# Patient Record
Sex: Female | Born: 1950 | Race: White | Hispanic: No | Marital: Married | Smoking: Never smoker
Health system: Southern US, Community
[De-identification: ages and names within clinical notes are randomized; demographics above are authoritative.]

## PROBLEM LIST (undated history)

## (undated) DIAGNOSIS — G629 Polyneuropathy, unspecified: Secondary | ICD-10-CM

## (undated) DIAGNOSIS — E559 Vitamin D deficiency, unspecified: Secondary | ICD-10-CM

## (undated) DIAGNOSIS — L988 Other specified disorders of the skin and subcutaneous tissue: Secondary | ICD-10-CM

## (undated) DIAGNOSIS — Z5189 Encounter for other specified aftercare: Secondary | ICD-10-CM

## (undated) DIAGNOSIS — R9389 Abnormal findings on diagnostic imaging of other specified body structures: Secondary | ICD-10-CM

## (undated) DIAGNOSIS — L409 Psoriasis, unspecified: Secondary | ICD-10-CM

## (undated) DIAGNOSIS — IMO0002 Reserved for concepts with insufficient information to code with codable children: Secondary | ICD-10-CM

## (undated) DIAGNOSIS — I10 Essential (primary) hypertension: Secondary | ICD-10-CM

## (undated) DIAGNOSIS — E785 Hyperlipidemia, unspecified: Secondary | ICD-10-CM

## (undated) DIAGNOSIS — H269 Unspecified cataract: Secondary | ICD-10-CM

## (undated) DIAGNOSIS — K635 Polyp of colon: Secondary | ICD-10-CM

## (undated) DIAGNOSIS — D219 Benign neoplasm of connective and other soft tissue, unspecified: Secondary | ICD-10-CM

## (undated) DIAGNOSIS — K6289 Other specified diseases of anus and rectum: Secondary | ICD-10-CM

## (undated) DIAGNOSIS — E079 Disorder of thyroid, unspecified: Secondary | ICD-10-CM

## (undated) HISTORY — DX: Encounter for other specified aftercare: Z51.89

## (undated) HISTORY — PX: IUD REMOVAL: SHX5392

## (undated) HISTORY — DX: Disorder of thyroid, unspecified: E07.9

## (undated) HISTORY — DX: Polyneuropathy, unspecified: G62.9

## (undated) HISTORY — DX: Vitamin D deficiency, unspecified: E55.9

## (undated) HISTORY — DX: Other specified diseases of anus and rectum: K62.89

## (undated) HISTORY — PX: POLYPECTOMY: SHX149

## (undated) HISTORY — DX: Essential (primary) hypertension: I10

## (undated) HISTORY — PX: CATARACT EXTRACTION, BILATERAL: SHX1313

## (undated) HISTORY — DX: Psoriasis, unspecified: L40.9

## (undated) HISTORY — DX: Benign neoplasm of connective and other soft tissue, unspecified: D21.9

## (undated) HISTORY — DX: Abnormal findings on diagnostic imaging of other specified body structures: R93.89

## (undated) HISTORY — DX: Unspecified cataract: H26.9

## (undated) HISTORY — PX: CATARACT EXTRACTION: SUR2

## (undated) HISTORY — DX: Reserved for concepts with insufficient information to code with codable children: IMO0002

## (undated) HISTORY — DX: Hyperlipidemia, unspecified: E78.5

## (undated) HISTORY — PX: COLONOSCOPY: SHX174

## (undated) HISTORY — DX: Polyp of colon: K63.5

## (undated) HISTORY — DX: Other specified disorders of the skin and subcutaneous tissue: L98.8

---

## 1977-10-15 HISTORY — PX: CHOLECYSTECTOMY: SHX55

## 1985-11-15 DIAGNOSIS — R87619 Unspecified abnormal cytological findings in specimens from cervix uteri: Secondary | ICD-10-CM

## 1985-11-15 DIAGNOSIS — IMO0002 Reserved for concepts with insufficient information to code with codable children: Secondary | ICD-10-CM

## 1985-11-15 HISTORY — DX: Unspecified abnormal cytological findings in specimens from cervix uteri: R87.619

## 1985-11-15 HISTORY — DX: Reserved for concepts with insufficient information to code with codable children: IMO0002

## 1999-02-24 ENCOUNTER — Other Ambulatory Visit: Admission: RE | Admit: 1999-02-24 | Discharge: 1999-02-24 | Payer: Self-pay | Admitting: *Deleted

## 2000-05-20 ENCOUNTER — Other Ambulatory Visit: Admission: RE | Admit: 2000-05-20 | Discharge: 2000-05-20 | Payer: Self-pay | Admitting: *Deleted

## 2001-05-21 ENCOUNTER — Other Ambulatory Visit: Admission: RE | Admit: 2001-05-21 | Discharge: 2001-05-21 | Payer: Self-pay | Admitting: *Deleted

## 2002-05-28 ENCOUNTER — Other Ambulatory Visit: Admission: RE | Admit: 2002-05-28 | Discharge: 2002-05-28 | Payer: Self-pay | Admitting: *Deleted

## 2003-06-03 ENCOUNTER — Other Ambulatory Visit: Admission: RE | Admit: 2003-06-03 | Discharge: 2003-06-03 | Payer: Self-pay | Admitting: *Deleted

## 2003-06-16 HISTORY — PX: HYSTEROSCOPY: SHX211

## 2003-06-25 ENCOUNTER — Ambulatory Visit (HOSPITAL_BASED_OUTPATIENT_CLINIC_OR_DEPARTMENT_OTHER): Admission: RE | Admit: 2003-06-25 | Discharge: 2003-06-25 | Payer: Self-pay | Admitting: Obstetrics and Gynecology

## 2003-06-25 ENCOUNTER — Ambulatory Visit (HOSPITAL_COMMUNITY): Admission: RE | Admit: 2003-06-25 | Discharge: 2003-06-25 | Payer: Self-pay | Admitting: Obstetrics and Gynecology

## 2003-06-25 ENCOUNTER — Encounter (INDEPENDENT_AMBULATORY_CARE_PROVIDER_SITE_OTHER): Payer: Self-pay | Admitting: Specialist

## 2004-06-07 ENCOUNTER — Other Ambulatory Visit: Admission: RE | Admit: 2004-06-07 | Discharge: 2004-06-07 | Payer: Self-pay | Admitting: *Deleted

## 2005-06-08 ENCOUNTER — Other Ambulatory Visit: Admission: RE | Admit: 2005-06-08 | Discharge: 2005-06-08 | Payer: Self-pay | Admitting: Obstetrics & Gynecology

## 2006-06-20 ENCOUNTER — Other Ambulatory Visit: Admission: RE | Admit: 2006-06-20 | Discharge: 2006-06-20 | Payer: Self-pay | Admitting: Obstetrics & Gynecology

## 2007-07-03 ENCOUNTER — Other Ambulatory Visit: Admission: RE | Admit: 2007-07-03 | Discharge: 2007-07-03 | Payer: Self-pay | Admitting: Obstetrics & Gynecology

## 2007-09-01 ENCOUNTER — Encounter (INDEPENDENT_AMBULATORY_CARE_PROVIDER_SITE_OTHER): Payer: Self-pay | Admitting: *Deleted

## 2007-09-01 ENCOUNTER — Ambulatory Visit (HOSPITAL_COMMUNITY): Admission: RE | Admit: 2007-09-01 | Discharge: 2007-09-01 | Payer: Self-pay | Admitting: *Deleted

## 2010-05-30 NOTE — Op Note (Signed)
NAME:  Kimberly Peck, GUNIA NO.:  [id number]   MEDICAL RECORD NO.:  [id number]          PATIENT TYPE:  AMB   LOCATION:  ENDO                         FACILITY:  [facility]   PHYSICIAN:  Georgiana Spinner, M.D.    DATE OF BIRTH:  Apr 16, 1950   DATE OF PROCEDURE:  DATE OF DISCHARGE:                               OPERATIVE REPORT   PROCEDURE:  Colonoscopy.   INDICATIONS:  Colon cancer screening.   ANESTHESIA:  Fentanyl 100 mcg, Versed 8 mg.   DESCRIPTION OF PROCEDURE:  With the patient mildly sedated in the left  lateral decubitus position, the Pentax videoscopic colonoscope was  inserted in the rectum and passed under direct vision with pressure  applied and the patient rolled to her back and subsequently to her right  side, we were able to reach the cecum, identified by ileocecal valve and  base of cecum, both of which were photographed.  From this point, the  colonoscope was slowly withdrawn taking circumferential views of the  colonic mucosa, stopping only to photograph diverticulum seen in the  sigmoid colon.  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  A polyp at 20 cm  that was also removed using hot biopsy forceps technique, setting of  20/150 blended current until we reached the rectum which appeared normal  on direct and showed small internal hemorrhoids on retroflexed view.  The endoscope was straightened and withdrawn.  The patient's vital signs  and pulse oximeter remained stable.  The patient tolerated the procedure  well without apparent complications.   FINDINGS:  Small internal hemorrhoids, polyp at 20 cm removed,  occasional diverticulum of the sigmoid colon, AVM of transverse colon,  otherwise an unremarkable exam.   PLAN:  Await biopsy report.  The patient will call me for results and  follow-up with me as needed as an outpatient.           ______________________________  Georgiana Spinner, M.D.     GMO/MEDQ  D:  09/01/2007  T:  09/01/2007  Job:  [id number]

## 2010-06-02 NOTE — Op Note (Signed)
NAME:  Kimberly Peck, Kimberly Peck                    ACCOUNT NO.:  [id number]   MEDICAL RECORD NO.:  [id number]                   PATIENT TYPE:  AMB   LOCATION:  [facility]                                 FACILITY:  [facility]   PHYSICIAN:  Laqueta Linden, M.D.                 DATE OF BIRTH:  10-15-1950   DATE OF PROCEDURE:  06/25/2003  DATE OF DISCHARGE:                                 OPERATIVE REPORT   PREOPERATIVE DIAGNOSES:  Imbedded intrauterine device, postmenopausal  bleeding.   POSTOPERATIVE DIAGNOSES:  Imbedded intrauterine device, postmenopausal  bleeding, uterine synechia.   PROCEDURE:  Operative hysteroscopic removal of IUD with diagnostic  hysteroscopy, curettage.   SURGEON:  Laqueta Linden, M.D.   ANESTHESIA:  General LMA.   ESTIMATED BLOOD LOSS:  Less than 50 mL.   SORBITOL NET INTAKE:  Less than 100 mL.   SPECIMENS:  IUD (ParaGard) discarded, endometrial curettings sent to  pathology.   COMPLICATIONS:  None.   INDICATIONS FOR PROCEDURE:  Kimberly Peck is a 60 year old menopausal  female who had a ParaGard IUD placed in October of 1998.  She had her last  menstrual period in October of 2002 and therefore no longer needs the IUD in  place.  Several attempts were made at office removal with traction on the  string but it appeared that the IUD was imbedded. Most recently she had an  episode of postmenopausal bleeding in December of 2005 like a regular  period.  It was felt that this should be evaluated as well and was very  likely related to the imbedded IUD. For this reason, the patient is to  undergo outpatient hysteroscopic evaluation with removal of the IUD,  evaluation of the uterine cavity and resection or curettage as indicated.  She has seen the informed consent film, voiced her understanding and  acceptance of all risks, benefits, alternatives, complications and  limitations of the procedure and agrees to proceed. She received Toradol 30  mg IV on call to the OR  and an additional 30 mg IM in the operating room  after anesthesia.   DESCRIPTION OF PROCEDURE:  The patient was taken to the operating room and  after proper identification and consents were ascertained, she was placed on  the operating table in supine position. General LMA anesthesia was  introduced and she was then placed in the Arena stirrups. The perineum and  vagina were prepped and draped in routine sterile fashion, the bladder was  entered with a red rubber catheter. The uterus was noted to be anterior  mobile with a large fundal fibroid noted. A speculum was placed in the  vagina, the cervix grasped with a single tooth tenaculum. The cervix was  distorted from prior cold knife conization. The internal os was gently  dilated to a #33 Pratt dilator and this was notable for a larger than  average amount of bleeding that appeared to aminate from inside the  uterine  cavity.  The hysteroscope was inserted under direct vision with the video.  The uterine cavity was quite distorted and appeared to have synechiae.  One  arm of the IUD was visualized protruding through one of the synechiae and  was grasped and the entire scope with IUD were then removed in one movement.  This was a ParaGard IUD and was discarded.  The scope was reinserted and  additional visualization of the uterine cavity revealed that there was a  normal appearing patch of fundus on the right aspect of the uterus that had  no lesion and looked atrophic. The remainder of the cavity towards the left  was distorted with synechiae and very scarred. There was no active bleeding  in the endometrial cavity.  The scope was then withdrawn and sharp curettage  was performed with virtually no tissue obtained, this was sent for  pathology.  All instruments were then removed, the tenaculum site was  hemostatic. There was no active bleeding from the cervix.  The patient was  stable and extubated on transfer to the recovery room. She  will be observed  and discharged per anesthesia protocol. She was given routine written and  verbal discharge instructions and is to followup in the office in one week.  She is to call sooner for excessive pain, fever, bleeding or other concerns  but knows to expect some moderate bleeding with possible clots over the next  few days. She is to take Advil or Aleve as needed for cramping and continue  all routine medications.                                               Laqueta Linden, M.D.    LKS/MEDQ  D:  06/25/2003  T:  06/25/2003  Job:  [id number]

## 2010-09-21 ENCOUNTER — Other Ambulatory Visit: Payer: Self-pay | Admitting: Obstetrics & Gynecology

## 2010-09-21 DIAGNOSIS — K604 Rectal fistula: Secondary | ICD-10-CM

## 2010-09-25 ENCOUNTER — Ambulatory Visit
Admission: RE | Admit: 2010-09-25 | Discharge: 2010-09-25 | Disposition: A | Discharge status: Home / Self Care | Payer: BC Managed Care – PPO | Source: Ambulatory Visit | Attending: Obstetrics & Gynecology | Admitting: Obstetrics & Gynecology

## 2010-09-25 DIAGNOSIS — K604 Rectal fistula: Secondary | ICD-10-CM

## 2010-09-25 MED ORDER — IOHEXOL 300 MG/ML  SOLN
100.0000 mL | Freq: Once | INTRAMUSCULAR | Status: AC | PRN
  Administered 2010-09-25: 100 mL via INTRAVENOUS

## 2010-10-13 ENCOUNTER — Encounter (INDEPENDENT_AMBULATORY_CARE_PROVIDER_SITE_OTHER): Payer: Self-pay | Admitting: General Surgery

## 2010-10-13 ENCOUNTER — Ambulatory Visit (INDEPENDENT_AMBULATORY_CARE_PROVIDER_SITE_OTHER): Payer: BC Managed Care – PPO | Admitting: General Surgery

## 2010-10-13 VITALS — BP 134/86 | HR 76 | Temp 97.9°F | Resp 16 | Ht 63.0 in | Wt 194.6 lb

## 2010-10-13 DIAGNOSIS — K603 Anal fistula: Secondary | ICD-10-CM | POA: Insufficient documentation

## 2010-10-13 NOTE — Progress Notes (Signed)
Chief Complaint  Patient presents with  . Other    new pt- eval of perirectal absc vs. fistula... pt not in pain just having drainging    HPI Kimberly Peck is a 60 y.o. female.   HPI 60 year old Caucasian female referred by Dr. Leda Quail for evaluation of rectal pain. The patient states that she's had several months of discomfort in her rectal area. The discomfort occurs at intermittent times. She describes an area that will get hard and swollen and become painful. It drained several times a week. It is mainly a yellowish liquid. She states that she started noticing problem several months ago when she started having an alteration in her bowel movements. Previously she did have one to 2 bowel movements a day. Now she reports on average 4 bowel movements a day. Some days however she will have 6-8 bowel movements a day. The initial bowel movements each day her solid but become progressively more liquid throughout the day. She denies any fecal urinary incontinence. She denies any personal or family history of inflammatory bowel disease. She denies any prior perirectal abscess or trauma. She denies any pain with defecation. She denies any melena, hematochezia, or weight loss. She denies any abdominal cramping or bloating. She does not sit on the bathroom toilet for prolonged periods of time. She states that she had a colonoscopy in 2010 and this was normal Past Medical History  Diagnosis Date  . Hypertension   . Thyroid disease     hypothyroidism  . Rectal pain     Past Surgical History  Procedure Date  . Cholecystectomy 1979  . Cataract extraction 2008    bilateral  . Intrauterine device insertion   . Iud removal     due to it embedding in the uterus    Family History  Problem Relation Age of Onset  . Cancer Mother     lung    Social History History  Substance Use Topics  . Smoking status: Never Smoker   . Smokeless tobacco: Not on file  . Alcohol Use: 4.2 oz/week    7  Glasses of wine per week    Allergies  Allergen Reactions  . Sulfa Drugs Cross Reactors Itching    Current Outpatient Prescriptions  Medication Sig Dispense Refill  . levothyroxine (SYNTHROID) 137 MCG tablet Take 137 mcg by mouth daily.        . nebivolol (BYSTOLIC) 10 MG tablet Take 10 mg by mouth daily.          Review of Systems Review of Systems  Constitutional: Negative for fever, chills, activity change, appetite change and unexpected weight change.  HENT: Negative for neck pain and neck stiffness.   Eyes: Negative for photophobia and redness.  Respiratory: Negative for chest tightness, shortness of breath and wheezing.   Cardiovascular: Negative for chest pain and leg swelling.  Gastrointestinal: Positive for rectal pain. Negative for nausea, vomiting, abdominal pain, constipation, abdominal distention and anal bleeding.       See hpi   Genitourinary: Negative for dysuria, urgency, hematuria, difficulty urinating and pelvic pain.  Musculoskeletal: Negative for back pain and arthralgias.  Skin: Negative.   Neurological: Negative for dizziness, tremors and syncope.  Hematological: Negative.   Psychiatric/Behavioral: Negative.     Blood pressure 134/86, pulse 76, temperature 97.9 F (36.6 C), temperature source Temporal, resp. rate 16, height 5\' 3"  (1.6 m), weight 194 lb 9.6 oz (88.27 kg).  Physical Exam Physical Exam  Vitals reviewed. Constitutional: She is  oriented to person, place, and time. She appears well-developed and well-nourished.  HENT:  Head: Normocephalic and atraumatic.  Eyes: Conjunctivae are normal. No scleral icterus.  Neck: Normal range of motion. Neck supple. No JVD present. No tracheal deviation present.  Cardiovascular: Normal rate, regular rhythm and normal heart sounds.   Pulmonary/Chest: Effort normal and breath sounds normal. No respiratory distress. She has no wheezes.  Abdominal: Soft. Bowel sounds are normal. She exhibits no distension.  There is no tenderness. There is no rebound and no guarding.         overweight  Genitourinary: Rectal exam shows tenderness. Rectal exam shows no fissure and anal tone normal.          Anoscopy attempted but aborted due to pt discomfort  Musculoskeletal: Normal range of motion. She exhibits no edema.  Lymphadenopathy:    She has no cervical adenopathy.  Neurological: She is alert and oriented to person, place, and time. She exhibits normal muscle tone.  Skin: Skin is warm and dry.  Psychiatric: She has a normal mood and affect. Her behavior is normal. Judgment and thought content normal.    Data Reviewed Dr Garen Lah note CT ab/pelvis from 09/25/10 - only showed a anterior uterine mass 9.7x8.3cm likely fibroid  Assessment    Rectal pain likely due to fistula in ano Obesity Hypothyroidism HTN Uterine fibroid    Plan    On physical exam as well as her history, this is consistent with a fistula in ano. By report she had a normal colonoscopy in 2010. I doubt she has developed Crohn's or ulcerative colitis in the interim. We discussed several treatment options. She is given educational material about fistulas. We discussed obtaining an MRI of her pelvis versus proceeding to the operating room for an exam under anesthesia.  I recommended an exam under anesthesia so that we could better delineate the sinus. We discussed several treatment options during surgery including fistulotomy versus seton placement. I explained to her that it does appear to be superficial, low fistula I would perform a fistulotomy. If the tract was high and complex, I would place a seton. We discussed the risk and benefits of surgery including but not limited to bleeding, infection, urinary retention, need for additional procedures, abscess formation, injury to the sphincter, incontinence, blood clot formation, and general anesthesia risk. We talked about typical postoperative course.  In the interim I recommended  that the patient increase her fiber intake. We discussed foods that were high in fiber. We also discussed fiber supplements.  We will plan to do an exam under anesthesia with fistulotomy versus seton placement. We also talked about the remote possibility of not being able to identify the fistula tract.  Mary Sella. Andrey Campanile, MD       Atilano Ina 10/13/2010, 3:28 PM

## 2010-10-13 NOTE — Patient Instructions (Addendum)
No aspirin or blood thinning products 5 days prior to surgery. Use one fleets enema per rectum the morning of surgery at least 2 hours prior to leaving the house.    Anal Fistula An anal fistula is an abnormal tunnel that leads from the anal canal (which carries stool from the large intestine) to a hole in the skin near the anus (the opening through which stool passes out of your body).  CAUSES Food you eat goes from your stomach into your intestine. As the food is digested, waste material (stool) forms. Stool passes through your large intestine, through the rectum and anal canal, and out of your body through the anus.  The anus has a number of tiny glands (clusters of specialized cells) that make lubricating fluid. Sometimes these glands can become infected. This type of infection may lead to the development of a pocket of pus (abscess). An anal fistula often develops after an infection or abscess; It is nearly always caused by a past anorectal abscess. You are at a higher risk of developing an anal fistula if you have:  Had an anal abscess.  Chronic inflammatory bowel disease, such as Crohn's disease or ulcerative colitis.   Conditions in which there are inflamed outpouchings of the intestinal wall (diverticulitis).   Colon or rectal cancer.  Sexually transmitted diseases involving the rectum, such as gonorrhea or Chlamydia.   History of anal radiation treatments, injury or surgery.   HIV infection.   A problem that has required treatment with steroid medicines for more than a short time.   SYMPTOMS   Anal pain, particularly around the area of a past abscess.  Drainage of pus, blood, stool or mucus from an opening in the skin.   Swelling around the skin opening   Worn off skin around the opening.   A hot or red area near the anus.  Diarrhea.   Fever and chills.   Tiredness (fatigue).   DIAGNOSIS  In some cases, the opening of an anal fistula is easily seen during a  physical exam.   A probe or scope may be used to help locate the opening of the fistula. In some cases, dye can be injected into the fistula opening, and x-rays can be taken to find the exact location and path of the fistula.   A sample (biopsy) of the fistula tissue or anus may be taken to check for cancer.  TREATMENT  An anal fistula may need surgery to open it up and allow it to heal. This type of operation is called a fistulotomy.   A specialized kind of glue or plug to seal the fistula may be used.   An antibiotic may be prescribed to treat an existing infection.  HOME CARE INSTRUCTIONS  Take medications (such as antibiotics) as prescribed by your caregiver.   Only take over-the-counter or prescription medicine for pain, discomfort or fever as directed by your caregiver.   Follow your prescribed diet. You might need a higher fiber diet to help avoid constipation.   Drink lots of water.   Use a stool softener or laxative, if recommended.   A warm sitz bath several times a day may be soothing, as well as help with healing.   Follow excellent hygiene to keep the anal area as clean as possible. Consider using pre-moistened towelettes to keep the anal area clean after using the bathroom.  SEEK MEDICAL CARE IF YOU:  Have increased pain not controlled with medicines prescribed.   Notice new  swelling, redness or hotness in the anal area.   Develop any problems passing urine.   Develop fever (more than 100.5 F (38.1 C).  SEEK IMMEDIATE MEDICAL CARE IF YOU:  Have severe, intolerable pain.   Have severe problems passing urine or cannot pass any urine at all.   Develop an unexplained oral temperature above 102.0 F (38.9 C).   Notice new or worsening leakage of blood, pus, mucus or stool.  Document Released: 12/15/2007 Document Re-Released: 03/28/2009 [facility] Patient Information 2011 [place], [place].

## 2013-01-19 ENCOUNTER — Encounter: Payer: Self-pay | Admitting: Obstetrics & Gynecology

## 2013-01-22 ENCOUNTER — Encounter: Payer: Self-pay | Admitting: Obstetrics & Gynecology

## 2013-01-22 ENCOUNTER — Ambulatory Visit (INDEPENDENT_AMBULATORY_CARE_PROVIDER_SITE_OTHER): Payer: BC Managed Care – PPO | Admitting: Obstetrics & Gynecology

## 2013-01-22 VITALS — BP 120/82 | HR 64 | Resp 12 | Ht 64.0 in | Wt 198.2 lb

## 2013-01-22 DIAGNOSIS — Z01419 Encounter for gynecological examination (general) (routine) without abnormal findings: Secondary | ICD-10-CM

## 2013-01-22 MED ORDER — NYSTATIN 100000 UNIT/GM EX POWD: Freq: Three times a day (TID) | CUTANEOUS | Status: DC

## 2013-01-22 MED ORDER — ALUMINUM CHLORIDE 20 % EX SOLN: Freq: Every day | CUTANEOUS | Status: DC

## 2013-01-22 MED ORDER — NYSTATIN 100000 UNIT/GM EX CREA: 1.0000 "application " | TOPICAL_CREAM | Freq: Two times a day (BID) | CUTANEOUS | Status: DC

## 2013-01-22 NOTE — Progress Notes (Signed)
63 y.o. G0P0000 MarriedCaucasianF here for annual exam.  Husband has children in [place].  Pt has six great grand children.  Saw Dr. Minna Antis in August.     Patient's last menstrual period was 01/15/2002.          Sexually active: yes  The current method of family planning is post menopausal status.    Exercising: yes  cardio, strength, and core Smoker:  no  Health Maintenance: Pap:  10/15/11 WNL/negative HR HPV History of abnormal Pap:  yes MMG:  07/09/12 normal Colonoscopy:  2009 repeat in 10 years BMD:   6/13, -1.7/-1.7 TDaP:  6/07 Screening Labs: PCP, Hb today: PCP, Urine today: PCP   reports that she has never smoked. She has never used smokeless tobacco. She reports that she drinks about 1.8 ounces of alcohol per week. She reports that she does not use illicit drugs.  Past Medical History  Diagnosis Date  . Hypertension   . Thyroid disease     hypothyroidism  . Rectal pain   . Abnormal Pap smear     CIN II/CKC  . Fibroids   . Fistula     benign-rectal    Past Surgical History  Procedure Laterality Date  . Cholecystectomy  1979  . Cataract extraction  2008    bilateral  . Intrauterine device insertion    . Iud removal      due to it embedding in the uterus    Current Outpatient Prescriptions  Medication Sig Dispense Refill  . calcium citrate-vitamin D (CITRACAL+D) 315-200 MG-UNIT per tablet Take 1 tablet by mouth daily.      . Cholecalciferol (VITAMIN D PO) Take 1,000 Int'l Units by mouth daily.       Marland Kitchen levothyroxine (SYNTHROID, LEVOTHROID) 150 MCG tablet Take 150 mcg by mouth daily before breakfast.      . Multiple Vitamins-Minerals (MULTIVITAMIN PO) Take by mouth daily.      . nebivolol (BYSTOLIC) 10 MG tablet Take 10 mg by mouth daily.         No current facility-administered medications for this visit.    Family History  Problem Relation Age of Onset  . Cancer Mother     lung    ROS:  Pertinent items are noted in HPI.  Otherwise, a comprehensive ROS was  negative.  Exam:   BP 120/82  Pulse 64  Resp 12  Ht 5\' 4"  (1.626 m)  Wt 198 lb 3.2 oz (89.903 kg)  BMI 34.00 kg/m2  LMP 01/15/2002  Weight change: -3lb  Height: 5\' 4"  (162.6 cm)  Ht Readings from Last 3 Encounters:  01/22/13 5\' 4"  (1.626 m)  10/13/10 5\' 3"  (1.6 m)    General appearance: alert, cooperative and appears stated age Head: Normocephalic, without obvious abnormality, atraumatic Neck: no adenopathy, supple, symmetrical, trachea midline and thyroid normal to inspection and palpation Lungs: clear to auscultation bilaterally Breasts: normal appearance, no masses or tenderness Heart: regular rate and rhythm Abdomen: soft, non-tender; bowel sounds normal; no masses,  no organomegaly Extremities: extremities normal, atraumatic, no cyanosis or edema Skin: Skin color, texture, turgor normal. Erythema underskin fold just above pubic symphasis Lymph nodes: Cervical, supraclavicular, and axillary nodes normal. No abnormal inguinal nodes palpated Neurologic: Grossly normal   Pelvic: External genitalia:  no lesions              Urethra:  normal appearing urethra with no masses, tenderness or lesions  Bartholins and Skenes: normal                 Vagina: normal appearing vagina with normal color and discharge, no lesions              Cervix: no lesions              Pap taken: no Bimanual Exam:  Uterus:  normal size, contour, position, consistency, mobility, non-tender and enlarged, 10 weeks size              Adnexa: normal adnexa               Rectovaginal: Confirms               Anus:  normal sphincter tone, no lesions  A:  Well Woman with normal exam H/o perirectal fistula, stable H/O 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 Lichen planus Hypertension H/O CIN 2 with CKC 1987  P:   Mammogram yearly pap smear with HR HPV 9/13.  No Pap today. Labs with Dr. Minna Antis Pt will try drysol for underarm moisture/odor Nystatin cream and powder rx's given for  skin changes.  If doesn't fully resolve skin issue, pt will call for return OV and skin biopsy. return annually or prn  An After Visit Summary was printed and given to the patient.

## 2013-01-22 NOTE — Patient Instructions (Signed)

## 2013-06-02 IMAGING — CT CT ABD-PELV W/ CM
2 of 5 series · 17 of 46 positions shown, 19 images · IV contrast (READICAT/WATER & [ID] OMNI 300)
Comparison: None

CLINICAL DATA: Rectal pain.  Diarrhea.

CT ABDOMEN AND PELVIS WITH CONTRAST
TECHNIQUE: Multidetector CT imaging of the abdomen and pelvis was
performed following the standard protocol during bolus
administration of intravenous contrast.
Contrast: 100mL OMNIPAQUE IOHEXOL 300 MG/ML IV SOLN

[Series 3: abd/pelvis with · axial · 0.75mm/px · z∈[-373,-18]mm · 14 of 79 slices shown, 16 images]
[im 4/79  soft-tissue]
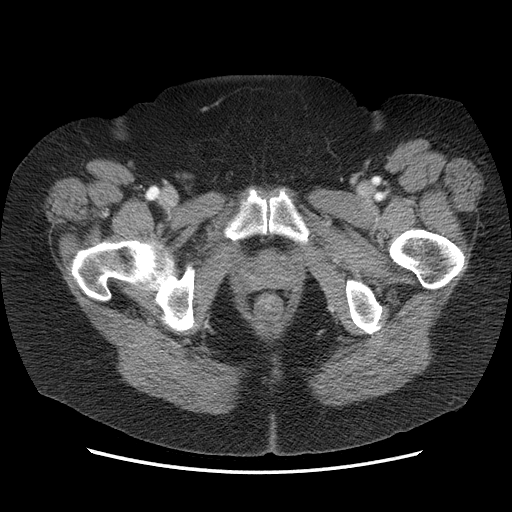
[im 4/79  bone]
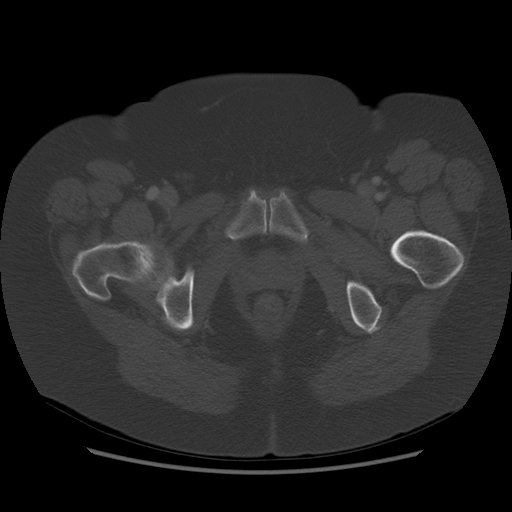
[im 12/79  soft-tissue]
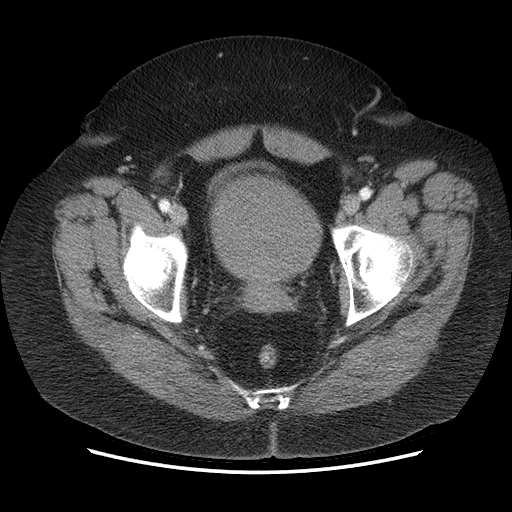
[im 16/79  soft-tissue]
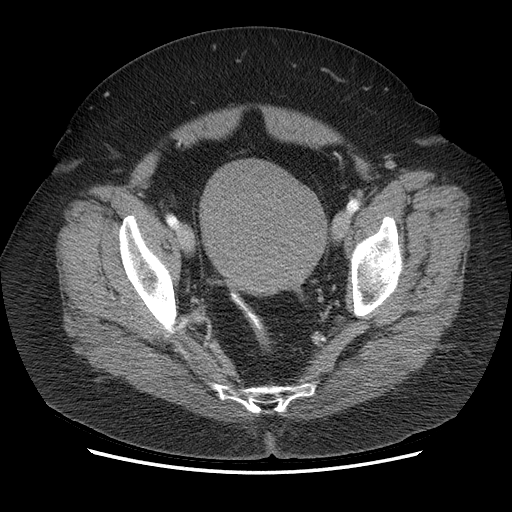
[im 20/79  soft-tissue]
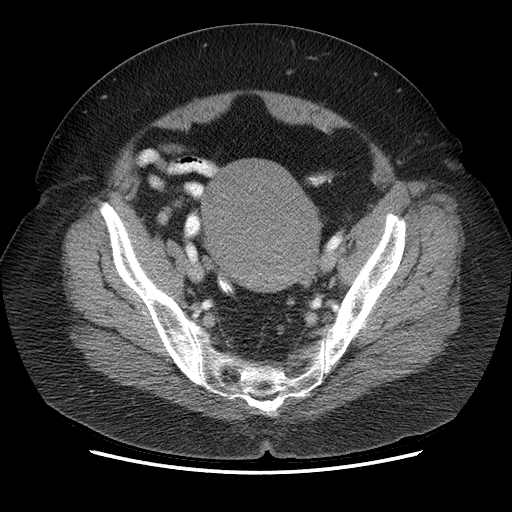
[im 28/79  soft-tissue]
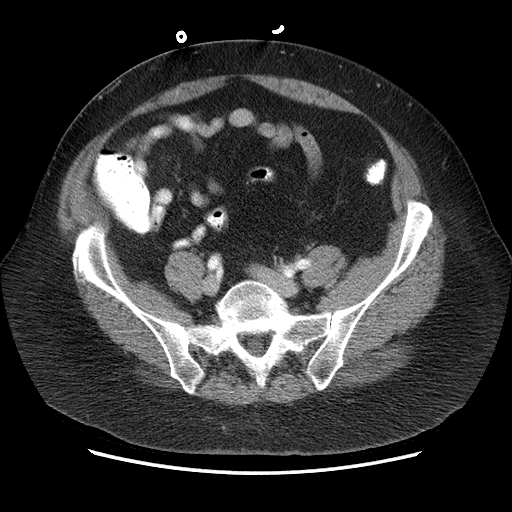
[im 32/79  soft-tissue]
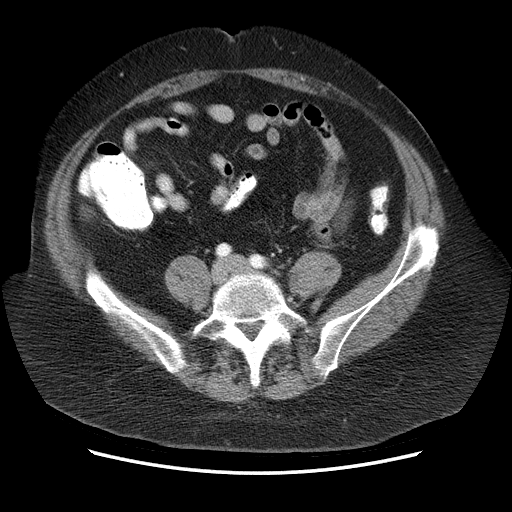
[im 36/79  soft-tissue]
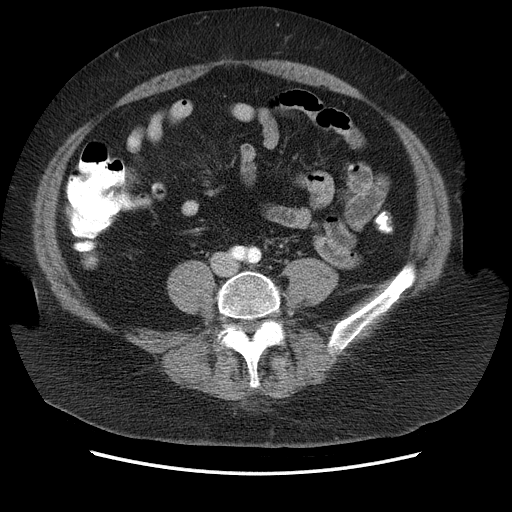
[im 43/79  soft-tissue]
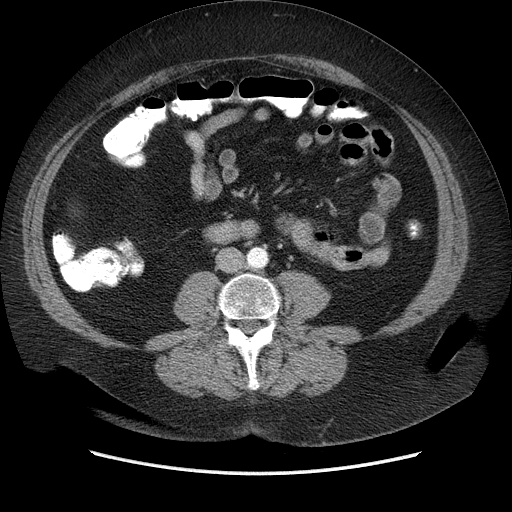
[im 47/79  soft-tissue]
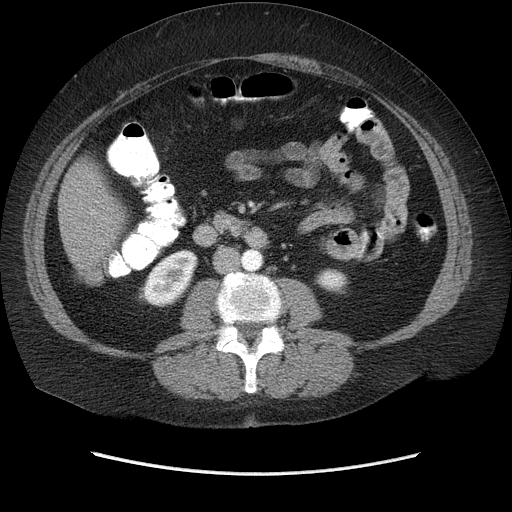
[im 47/79  bone]
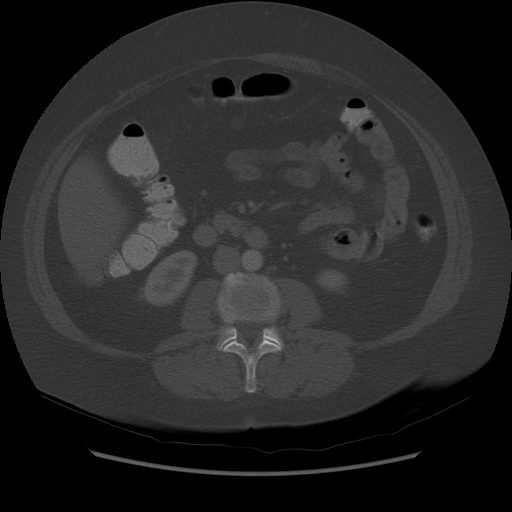
[im 51/79  soft-tissue]
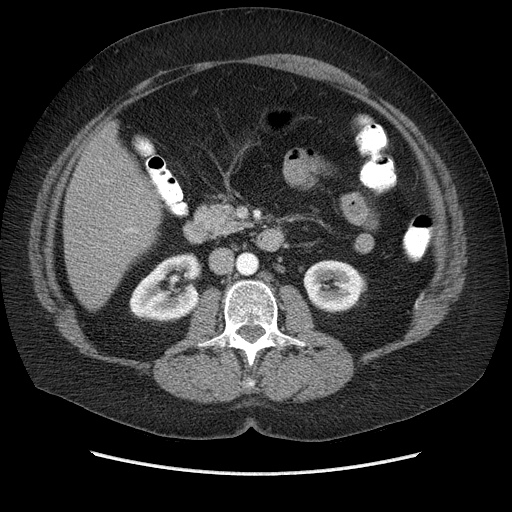
[im 59/79  soft-tissue]
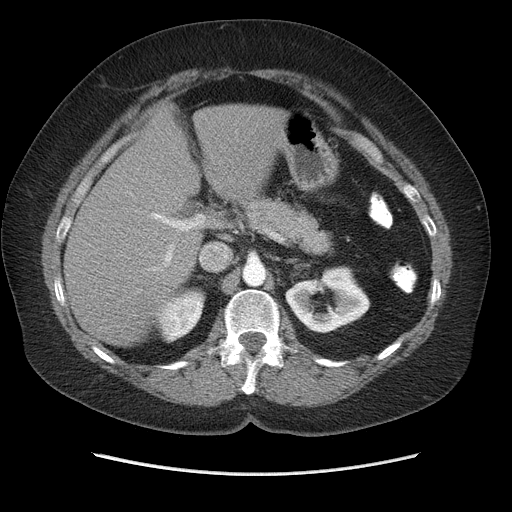
[im 63/79  soft-tissue]
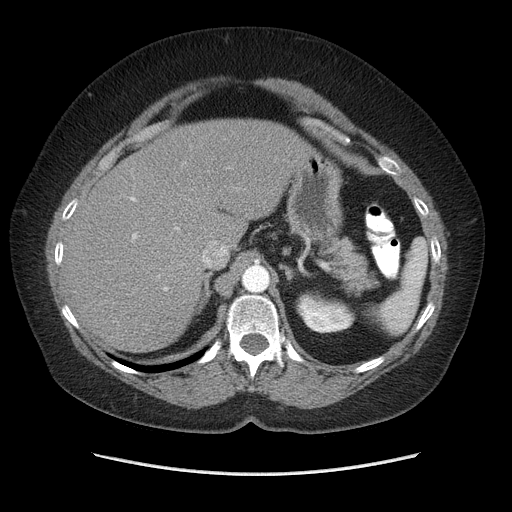
[im 67/79  soft-tissue]
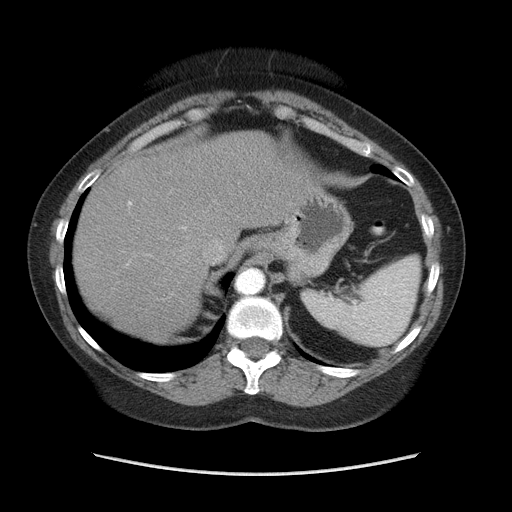
[im 75/79  soft-tissue]
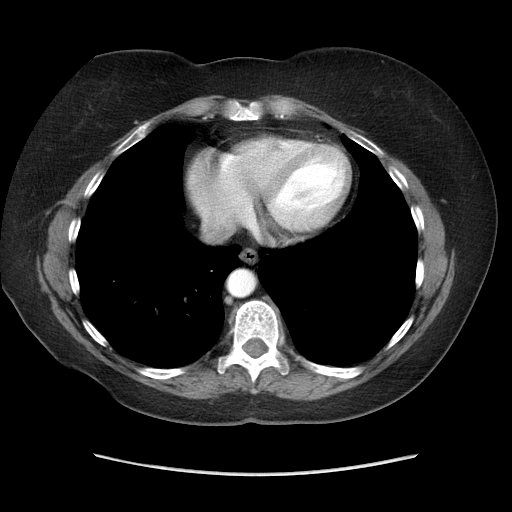

[Series 200: cor · coronal · 0.95mm/px · 3 of 145 slices shown]
[im 49/145  soft-tissue]
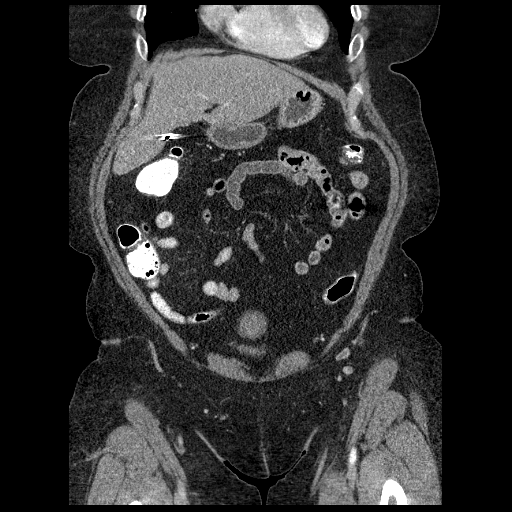
[im 65/145  soft-tissue]
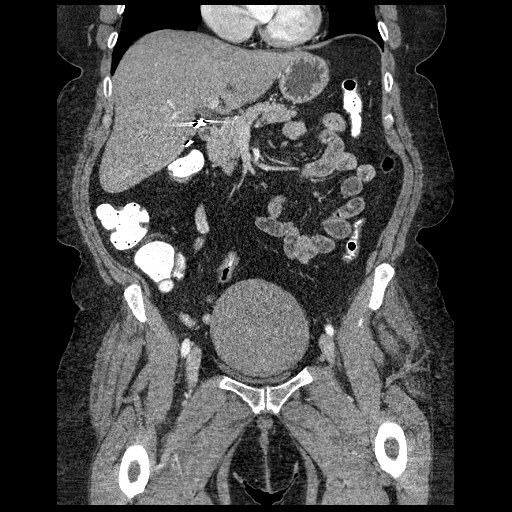
[im 81/145  soft-tissue]
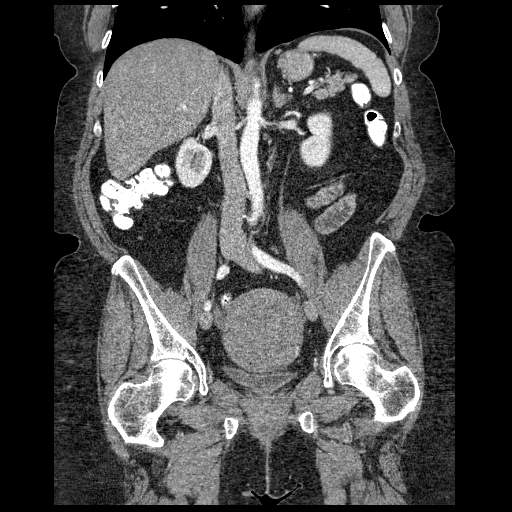

[17 of 46 positions shown; findings below may reference images not displayed]

FINDINGS: The lung bases are clear.

The liver is unremarkable.  No focal lesions or intrahepatic
biliary dilatation.  The gallbladder is surgically absent.  No
common bile duct dilatation.  The pancreas is normal.  The spleen
is normal in size.  No focal lesions.  The adrenal glands and
kidneys are unremarkable.

The stomach, duodenum, small bowel and colon demonstrate no
significant findings.  No inflammatory changes or mass.  No CT
findings to suggest a perirectal fistula.

The aorta is normal in caliber.  The major branch vessels are
normal.  No mesenteric or retroperitoneal masses or
lymphadenopathy.

There is a large anterior uterine mass.  This is likely a fibroid.
This measures 9.7 x 8.3 cm.  Both ovaries are normal. No pelvic
adenopathy or free pelvic fluid collections.  Small inguinal lymph
nodes are noted.

The bony structures are unremarkable.
IMPRESSION: 1.  9.7 x 8.3 cm anterior uterine mass.  This is most likely a
large fibroid. Pelvic MRI would be helpful for better
characterization.
2.  No CT findings for a perirectal abscess or fistula.

## 2014-01-27 ENCOUNTER — Telehealth: Payer: Self-pay | Admitting: Obstetrics & Gynecology

## 2014-01-27 NOTE — Telephone Encounter (Signed)
Pt left voicemail to cancel her aex with Dr Sabra Heck on 01/29/14. Called to reschedule. Left voicemail to call back.

## 2014-01-29 ENCOUNTER — Ambulatory Visit: Payer: BC Managed Care – PPO | Admitting: Obstetrics & Gynecology

## 2014-03-15 NOTE — Telephone Encounter (Signed)
Patient has AEX scheduled on 04/19/14 with DL//kn

## 2014-04-19 ENCOUNTER — Ambulatory Visit: Payer: Self-pay | Admitting: Certified Nurse Midwife

## 2014-04-19 ENCOUNTER — Telehealth: Payer: Self-pay | Admitting: Certified Nurse Midwife

## 2014-04-19 NOTE — Telephone Encounter (Signed)
canceled due to  [employer] medic[employer] billing to get authorization and call to reschedule

## 2014-04-26 NOTE — Telephone Encounter (Signed)
Patient is asking if authorization has been obtained from [employer] so that she can reschedule her aex appointment.

## 2014-04-26 NOTE — Telephone Encounter (Signed)
Called patient to advise that her preservice determination was forwarded to [facility] on 04/22/14. At this time we are waiting to hear back from them. I left her a voice mail message with instructions to call back if she has additional questions.

## 2014-04-27 NOTE — Telephone Encounter (Signed)
Pt called to reschedule. Pt scheduled with Dr Quincy Simmonds May 18. Authorization listed in appt info.

## 2014-04-27 NOTE — Telephone Encounter (Signed)
[employer] called with authorization # [id number], left message for patient to call and schedule aex.

## 2014-05-10 ENCOUNTER — Ambulatory Visit: Payer: Self-pay | Admitting: Certified Nurse Midwife

## 2014-06-02 ENCOUNTER — Encounter: Payer: Self-pay | Admitting: Obstetrics and Gynecology

## 2014-06-02 ENCOUNTER — Ambulatory Visit (INDEPENDENT_AMBULATORY_CARE_PROVIDER_SITE_OTHER): Payer: Medicare Other | Admitting: Obstetrics and Gynecology

## 2014-06-02 VITALS — BP 130/80 | HR 80 | Resp 16 | Ht 63.75 in | Wt 200.0 lb

## 2014-06-02 DIAGNOSIS — B372 Candidiasis of skin and nail: Secondary | ICD-10-CM | POA: Diagnosis not present

## 2014-06-02 DIAGNOSIS — Z01419 Encounter for gynecological examination (general) (routine) without abnormal findings: Secondary | ICD-10-CM

## 2014-06-02 DIAGNOSIS — M8580 Other specified disorders of bone density and structure, unspecified site: Secondary | ICD-10-CM

## 2014-06-02 MED ORDER — FLUCONAZOLE 150 MG PO TABS: 150.0000 mg | ORAL_TABLET | Freq: Once | ORAL | Status: DC

## 2014-06-02 NOTE — Patient Instructions (Signed)

## 2014-06-02 NOTE — Progress Notes (Signed)
Patient ID: Kimberly Peck, female   DOB: 12/18/1950, 64 y.o.   MRN: [id number] 63 y.o. [place] Married Caucasian female here for annual exam.    Having recurrent yeast infection on abdominal skin.  Has Nystatin cream and powder.   Frustrated with her weight.  Does not want to see nutritionist.   PCP: Tommy Medal, MD   Patient's last menstrual period was 01/15/2002.          Sexually active: Yes.    The current method of family planning is post menopausal status.    Exercising: YES  cardio, strength, core Smoker:  No  Health Maintenance: Pap:  10-15-11 wnl:neg HR HPV History of abnormal Pap:  no MMG:  07-13-13 fatty tissue/nl:[facility] Colonoscopy:  2009 normal with BMD:   06/2011   Result  -1.7/-1.7:[facility] TDaP:  06/2005 Screening Labs:  Hb today: PCP, Urine today: PCP   reports that she has never smoked. She has never used smokeless tobacco. She reports that she drinks about 1.8 - 2.4 oz of alcohol per week. She reports that she does not use illicit drugs.  Past Medical History  Diagnosis Date  . Hypertension   . Thyroid disease     hypothyroidism  . Rectal pain   . 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 Fibroids   . Fistula     benign-rectal    Past Surgical History  Procedure Laterality Date  . Cholecystectomy  10/79  . Cataract extraction Bilateral 3/08, 6/09  . Hysteroscopy  6/05    removal of imbedded IUD    Current Outpatient Prescriptions  Medication Sig Dispense Refill  . calcium citrate-vitamin D (CITRACAL+D) 315-200 MG-UNIT per tablet Take 1 tablet by mouth daily.    . Cholecalciferol (VITAMIN D PO) Take 1,000 Int'l Units by mouth daily.     Marland Kitchen levothyroxine (SYNTHROID, LEVOTHROID) 150 MCG tablet Take 150 mcg by mouth daily before breakfast.    . metoprolol tartrate (LOPRESSOR) 25 MG tablet Take 25 mg by mouth daily.    . Multiple Vitamins-Minerals (MULTIVITAMIN PO) Take by mouth daily.    Marland Kitchen aluminum chloride (DRYSOL) 20 % external solution Apply  topically at bedtime. (Patient not taking: Reported on 06/02/2014) 35 mL 5   No current facility-administered medications for this visit.    Family History  Problem Relation Age of Onset  . Cancer Mother     lung    ROS:  Pertinent items are noted in HPI.  Otherwise, a comprehensive ROS was negative.  Exam:   BP 130/80 mmHg  Pulse 80  Resp 16  Ht 5' 3.75" (1.619 m)  Wt 200 lb (90.719 kg)  BMI 34.61 kg/m2  LMP 01/15/2002    General appearance: alert, cooperative and appears stated age Head: Normocephalic, without obvious abnormality, atraumatic Neck: no adenopathy, supple, symmetrical, trachea midline and thyroid normal to inspection and palpation Lungs: clear to auscultation bilaterally Breasts: normal appearance, no masses or tenderness, Inspection negative, No nipple retraction or dimpling, No nipple discharge or bleeding Heart: regular rate and rhythm Abdomen: RUQ oblique incision, soft, non-tender; bowel sounds normal; no masses,  no organomegaly Extremities: extremities normal, atraumatic, no cyanosis or edema Skin: Skin color, texture, turgor normal.  Rash with skin cracking under pannus.  Lymph nodes: Cervical, supraclavicular, and axillary nodes normal. No abnormal inguinal nodes palpated Neurologic: Grossly normal  Pelvic: External genitalia:  no lesions              Urethra:  normal appearing  urethra with no masses, tenderness or lesions              Bartholins and Skenes: normal                 Vagina: normal appearing vagina with normal color and discharge, no lesions              Cervix: Unable to visualize due to limited ability to use stirrups due to leg cramps.              Pap taken: Yes.   Bimanual Exam:  Uterus:  normal size, contour, position, consistency, mobility, non-tender              Adnexa: normal adnexa and no mass, fullness, tenderness              Rectovaginal: Yes.  .  Confirms.              Anus:  normal sphincter tone, no lesions  Chaperone  was present for exam.  Assessment:   Well woman visit with normal exam. Osteopenia.  Candida infection of skin.  Limited pelvic exam due to body habitus.    Plan: Yearly mammogram recommended after age 36.  Recommended self breast exam.  Pap and HR HPV as above. Discussed Calcium, Vitamin D, regular exercise program including cardiovascular and weight bearing exercise. Labs performed.  No..   See orders. Refills given on medications.  Yes.  .  See orders.  See order for Diflucan.  Instructed in use.  Use Nystatin powder for maintenance and use cotton underwear! Offered pelvic ultrasound and this was declined, but patient will consider.  Bone density follow up with PCP.  Follow up annually and prn.      After visit summary provided.

## 2014-06-03 LAB — IPS PAP SMEAR ONLY

## 2015-02-28 DIAGNOSIS — E782 Mixed hyperlipidemia: Secondary | ICD-10-CM | POA: Diagnosis not present

## 2015-02-28 DIAGNOSIS — I10 Essential (primary) hypertension: Secondary | ICD-10-CM | POA: Diagnosis not present

## 2015-02-28 DIAGNOSIS — E039 Hypothyroidism, unspecified: Secondary | ICD-10-CM | POA: Diagnosis not present

## 2015-03-07 DIAGNOSIS — I10 Essential (primary) hypertension: Secondary | ICD-10-CM | POA: Diagnosis not present

## 2015-03-07 DIAGNOSIS — R21 Rash and other nonspecific skin eruption: Secondary | ICD-10-CM | POA: Diagnosis not present

## 2015-03-07 DIAGNOSIS — E782 Mixed hyperlipidemia: Secondary | ICD-10-CM | POA: Diagnosis not present

## 2015-03-07 DIAGNOSIS — E039 Hypothyroidism, unspecified: Secondary | ICD-10-CM | POA: Diagnosis not present

## 2015-06-03 ENCOUNTER — Ambulatory Visit (INDEPENDENT_AMBULATORY_CARE_PROVIDER_SITE_OTHER): Payer: Medicare Other | Admitting: Obstetrics and Gynecology

## 2015-06-03 ENCOUNTER — Encounter: Payer: Self-pay | Admitting: Obstetrics and Gynecology

## 2015-06-03 VITALS — BP 130/82 | HR 76 | Resp 16 | Ht 63.5 in | Wt 197.0 lb

## 2015-06-03 DIAGNOSIS — D259 Leiomyoma of uterus, unspecified: Secondary | ICD-10-CM

## 2015-06-03 DIAGNOSIS — R21 Rash and other nonspecific skin eruption: Secondary | ICD-10-CM

## 2015-06-03 DIAGNOSIS — Z01419 Encounter for gynecological examination (general) (routine) without abnormal findings: Secondary | ICD-10-CM

## 2015-06-03 DIAGNOSIS — Z124 Encounter for screening for malignant neoplasm of cervix: Secondary | ICD-10-CM

## 2015-06-03 MED ORDER — NYSTATIN 100000 UNIT/GM EX POWD: Freq: Three times a day (TID) | CUTANEOUS | Status: DC

## 2015-06-03 MED ORDER — FLUCONAZOLE 150 MG PO TABS: 150.0000 mg | ORAL_TABLET | Freq: Once | ORAL | Status: DC

## 2015-06-03 NOTE — Progress Notes (Signed)
Patient ID: [name], female   DOB: 08-04-1950, 65 y.o.   MRN: [id number] 65 y.o. [place] Married Caucasian female here for annual exam.    Has some yeast on the lower abdominal skin and maybe the back.   Had bone density in September 27, 2014 through PCP.  Minimal osteopenia was seen in the hips.  [employer].  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   PCP:   Merrilee Seashore, MD  Patient's last menstrual period was 01/15/2002.           Sexually active: Yes.  female  The current method of family planning is post menopausal status.    Exercising: Yes.    weights, strength, core and aerobics. Smoker:  no  Health Maintenance: Pap:  06-02-14 Neg(HR HPV neg 09/2011) History of abnormal Pap:  no MMG:  07-15-14 3D/Density A/Neg/BiRads1:[facility].   Colonoscopy:  2009 normal;next due 2019(unsure of GI name) BMD:   06/2011  Result:-1.7/-1.7:[facility].  2016 - mild osteopenia.  [employer]. TDaP:  PCP Gardasil:   N/A Hep C:  Declined.  Screening Labs:  Hb today: PCP, Urine today: PCP   reports that she has never smoked. She has never used smokeless tobacco. She reports that she drinks about 4.2 - 6.0 oz of alcohol per week. She reports that she does not use illicit drugs.  Past Medical History  Diagnosis Date  . Hypertension   . Thyroid disease     hypothyroidism  . Rectal pain   . 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 Fibroids   . Fistula     benign-rectal    Past Surgical History  Procedure Laterality Date  . Cholecystectomy  10/79  . Cataract extraction Bilateral 3/08, 6/09  . Hysteroscopy  6/05    removal of imbedded IUD    Current Outpatient Prescriptions  Medication Sig Dispense Refill  . Calcium Carb-Cholecalciferol (CALCIUM 600+D3) 600-200 MG-UNIT TABS Take 1 tablet by mouth daily.    . Cholecalciferol (VITAMIN D PO) Take 1,000 Int'l Units by mouth daily.     Marland Kitchen  levothyroxine (SYNTHROID, LEVOTHROID) 175 MCG tablet Take 1 tablet by mouth daily.  4  . metoprolol succinate (TOPROL-XL) 25 MG 24 hr tablet Take 1 tablet by mouth daily.  4  . Multiple Vitamins-Minerals (MULTIVITAMIN PO) Take by mouth daily.    Marland Kitchen triamcinolone cream (KENALOG) 0.1 % Apply 1 application topically as needed.  0   No current facility-administered medications for this visit.    Family History  Problem Relation Age of Onset  . Cancer Mother     lung    ROS:  Pertinent items are noted in HPI.  Otherwise, a comprehensive ROS was negative.  Exam:   BP 130/82 mmHg  Pulse 76  Resp 16  Ht 5' 3.5" (1.613 m)  Wt 197 lb (89.359 kg)  BMI 34.35 kg/m2  LMP 01/15/2002    General appearance: alert, cooperative and appears stated age Head: Normocephalic, without obvious abnormality, atraumatic Neck: no adenopathy, supple, symmetrical, trachea midline and thyroid normal to inspection and palpation Lungs: clear to auscultation bilaterally Breasts: normal appearance, no masses or tenderness, Inspection negative, No nipple retraction or dimpling, No nipple discharge or bleeding, No axillary or supraclavicular adenopathy Heart: regular rate and rhythm Abdomen: incisions:  Yes.     Right upper quadrant oblique , soft, non-tender; no masses, no organomegaly Extremities: extremities normal, atraumatic,  no cyanosis or edema Skin: Skin color, texture, turgor normal.  Excoriations of lower back in sacral area, over bilateral medial wrists, and lower abdomen suprapubically.  Lymph nodes: Cervical, supraclavicular, and axillary nodes normal. No abnormal inguinal nodes palpated Neurologic: Grossly normal  Pelvic: External genitalia:  Suprapubic erythema and excoriations under panus.               Urethra:  normal appearing urethra with no masses, tenderness or lesions              Bartholins and Skenes: normal                 Vagina: normal appearing vagina with normal color and discharge, no  lesions              Cervix:  difficult to see well.  Palpably normal.              Pap taken: No. Bimanual Exam:  Uterus:  midline pelvic mass? Nontender.              Adnexa: difficult to feel separately from mass.  BM exam limited by body habitus.               Rectal exam: Yes.  .  Confirms.              Anus:  normal sphincter tone, no lesions  Chaperone was present for exam.  Assessment:   Well woman visit. Hx of 10 cm uterine fibroid. Osteopenia.  Candida infection of skin.  Skin rash.  Chronic.  Plan: Yearly mammogram recommended after age 20.  Recommended self breast exam.  Pap and HR HPV as above. Recommendations for calcium, Vitamin D, regular exercise program including cardiovascular and weight bearing exercise. Labs performed.  No..   See orders. Prescription medication(s) given.   Yes. See orders.  Diflucan and Nystatin powder. Return for office pelvic ultrasound.  Referral to dermatology.  Follow up annually and prn.       After visit summary provided.

## 2015-06-03 NOTE — Patient Instructions (Signed)
Health Maintenance, Female Adopting a healthy lifestyle and getting preventive care can go a long way to promote health and wellness. Talk with your health care provider about what schedule of regular examinations is right for you. This is a good chance for you to check in with your provider about disease prevention and staying healthy. In between checkups, there are plenty of things you can do on your own. Experts have done a lot of research about which lifestyle changes and preventive measures are most likely to keep you healthy. Ask your health care provider for more information. WEIGHT AND DIET  Eat a healthy diet  Be sure to include plenty of vegetables, fruits, low-fat dairy products, and lean protein.  Do not eat a lot of foods high in solid fats, added sugars, or salt.  Get regular exercise. This is one of the most important things you can do for your health.  Most adults should exercise for at least 150 minutes each week. The exercise should increase your heart rate and make you sweat (moderate-intensity exercise).  Most adults should also do strengthening exercises at least twice a week. This is in addition to the moderate-intensity exercise.  Maintain a healthy weight  Body mass index (BMI) is a measurement that can be used to identify possible weight problems. It estimates body fat based on height and weight. Your health care provider can help determine your BMI and help you achieve or maintain a healthy weight.  For females 20 years of age and older:   A BMI below 18.5 is considered underweight.  A BMI of 18.5 to 24.9 is normal.  A BMI of 25 to 29.9 is considered overweight.  A BMI of 30 and above is considered obese.  Watch levels of cholesterol and blood lipids  You should start having your blood tested for lipids and cholesterol at 67 years of age, then have this test every 5 years.  You may need to have your cholesterol levels checked more often if:  Your lipid  or cholesterol levels are high.  You are older than 67 years of age.  You are at high risk for heart disease.  CANCER SCREENING   Lung Cancer  Lung cancer screening is recommended for adults 55-80 years old who are at high risk for lung cancer because of a history of smoking.  A yearly low-dose CT scan of the lungs is recommended for people who:  Currently smoke.  Have quit within the past 15 years.  Have at least a 30-pack-year history of smoking. A pack year is smoking an average of one pack of cigarettes a day for 1 year.  Yearly screening should continue until it has been 15 years since you quit.  Yearly screening should stop if you develop a health problem that would prevent you from having lung cancer treatment.  Breast Cancer  Practice breast self-awareness. This means understanding how your breasts normally appear and feel.  It also means doing regular breast self-exams. Let your health care provider know about any changes, no matter how small.  If you are in your 20s or 30s, you should have a clinical breast exam (CBE) by a health care provider every 1-3 years as part of a regular health exam.  If you are 40 or older, have a CBE every year. Also consider having a breast X-ray (mammogram) every year.  If you have a family history of breast cancer, talk to your health care provider about genetic screening.  If you   are at high risk for breast cancer, talk to your health care provider about having an MRI and a mammogram every year.  Breast cancer gene (BRCA) assessment is recommended for women who have family members with BRCA-related cancers. BRCA-related cancers include:  Breast.  Ovarian.  Tubal.  Peritoneal cancers.  Results of the assessment will determine the need for genetic counseling and BRCA1 and BRCA2 testing. Cervical Cancer Your health care provider may recommend that you be screened regularly for cancer of the pelvic organs (ovaries, uterus, and  vagina). This screening involves a pelvic examination, including checking for microscopic changes to the surface of your cervix (Pap test). You may be encouraged to have this screening done every 3 years, beginning at age 21.  For women ages 30-65, health care providers may recommend pelvic exams and Pap testing every 3 years, or they may recommend the Pap and pelvic exam, combined with testing for human papilloma virus (HPV), every 5 years. Some types of HPV increase your risk of cervical cancer. Testing for HPV may also be done on women of any age with unclear Pap test results.  Other health care providers may not recommend any screening for nonpregnant women who are considered low risk for pelvic cancer and who do not have symptoms. Ask your health care provider if a screening pelvic exam is right for you.  If you have had past treatment for cervical cancer or a condition that could lead to cancer, you need Pap tests and screening for cancer for at least 20 years after your treatment. If Pap tests have been discontinued, your risk factors (such as having a new sexual partner) need to be reassessed to determine if screening should resume. Some women have medical problems that increase the chance of getting cervical cancer. In these cases, your health care provider may recommend more frequent screening and Pap tests. Colorectal Cancer  This type of cancer can be detected and often prevented.  Routine colorectal cancer screening usually begins at 67 years of age and continues through 67 years of age.  Your health care provider may recommend screening at an earlier age if you have risk factors for colon cancer.  Your health care provider may also recommend using home test kits to check for hidden blood in the stool.  A small camera at the end of a tube can be used to examine your colon directly (sigmoidoscopy or colonoscopy). This is done to check for the earliest forms of colorectal  cancer.  Routine screening usually begins at age 50.  Direct examination of the colon should be repeated every 5-10 years through 67 years of age. However, you may need to be screened more often if early forms of precancerous polyps or small growths are found. Skin Cancer  Check your skin from head to toe regularly.  Tell your health care provider about any new moles or changes in moles, especially if there is a change in a mole's shape or color.  Also tell your health care provider if you have a mole that is larger than the size of a pencil eraser.  Always use sunscreen. Apply sunscreen liberally and repeatedly throughout the day.  Protect yourself by wearing long sleeves, pants, a wide-brimmed hat, and sunglasses whenever you are outside. HEART DISEASE, DIABETES, AND HIGH BLOOD PRESSURE   High blood pressure causes heart disease and increases the risk of stroke. High blood pressure is more likely to develop in:  People who have blood pressure in the high end   of the normal range (130-139/85-89 mm Hg).  People who are overweight or obese.  People who are African American.  If you are 38-23 years of age, have your blood pressure checked every 3-5 years. If you are 61 years of age or older, have your blood pressure checked every year. You should have your blood pressure measured twice--once when you are at a hospital or clinic, and once when you are not at a hospital or clinic. Record the average of the two measurements. To check your blood pressure when you are not at a hospital or clinic, you can use:  An automated blood pressure machine at a pharmacy.  A home blood pressure monitor.  If you are between 45 years and 39 years old, ask your health care provider if you should take aspirin to prevent strokes.  Have regular diabetes screenings. This involves taking a blood sample to check your fasting blood sugar level.  If you are at a normal weight and have a low risk for diabetes,  have this test once every three years after 68 years of age.  If you are overweight and have a high risk for diabetes, consider being tested at a younger age or more often. PREVENTING INFECTION  Hepatitis B  If you have a higher risk for hepatitis B, you should be screened for this virus. You are considered at high risk for hepatitis B if:  You were born in a country where hepatitis B is common. Ask your health care provider which countries are considered high risk.  Your parents were born in a high-risk country, and you have not been immunized against hepatitis B (hepatitis B vaccine).  You have HIV or AIDS.  You use needles to inject street drugs.  You live with someone who has hepatitis B.  You have had sex with someone who has hepatitis B.  You get hemodialysis treatment.  You take certain medicines for conditions, including cancer, organ transplantation, and autoimmune conditions. Hepatitis C  Blood testing is recommended for:  Everyone born from [birth year] through 1967.  Anyone with known risk factors for hepatitis C. Sexually transmitted infections (STIs)  You should be screened for sexually transmitted infections (STIs) including gonorrhea and chlamydia if:  You are sexually active and are younger than 67 years of age.  You are older than 67 years of age and your health care provider tells you that you are at risk for this type of infection.  Your sexual activity has changed since you were last screened and you are at an increased risk for chlamydia or gonorrhea. Ask your health care provider if you are at risk.  If you do not have HIV, but are at risk, it may be recommended that you take a prescription medicine daily to prevent HIV infection. This is called pre-exposure prophylaxis (PrEP). You are considered at risk if:  You are sexually active and do not regularly use condoms or know the HIV status of your partner(s).  You take drugs by injection.  You are sexually  active with a partner who has HIV. Talk with your health care provider about whether you are at high risk of being infected with HIV. If you choose to begin PrEP, you should first be tested for HIV. You should then be tested every 3 months for as long as you are taking PrEP.  PREGNANCY   If you are premenopausal and you may become pregnant, ask your health care provider about preconception counseling.  If you may  become pregnant, take 400 to 800 micrograms (mcg) of folic acid every day.  If you want to prevent pregnancy, talk to your health care provider about birth control (contraception). OSTEOPOROSIS AND MENOPAUSE   Osteoporosis is a disease in which the bones lose minerals and strength with aging. This can result in serious bone fractures. Your risk for osteoporosis can be identified using a bone density scan.  If you are 61 years of age or older, or if you are at risk for osteoporosis and fractures, ask your health care provider if you should be screened.  Ask your health care provider whether you should take a calcium or vitamin D supplement to lower your risk for osteoporosis.  Menopause may have certain physical symptoms and risks.  Hormone replacement therapy may reduce some of these symptoms and risks. Talk to your health care provider about whether hormone replacement therapy is right for you.  HOME CARE INSTRUCTIONS   Schedule regular health, dental, and eye exams.  Stay current with your immunizations.   Do not use any tobacco products including cigarettes, chewing tobacco, or electronic cigarettes.  If you are pregnant, do not drink alcohol.  If you are breastfeeding, limit how much and how often you drink alcohol.  Limit alcohol intake to no more than 1 drink per day for nonpregnant women. One drink equals 12 ounces of beer, 5 ounces of wine, or 1 ounces of hard liquor.  Do not use street drugs.  Do not share needles.  Ask your health care provider for help if  you need support or information about quitting drugs.  Tell your health care provider if you often feel depressed.  Tell your health care provider if you have ever been abused or do not feel safe at home.   This information is not intended to replace advice given to you by your health care provider. Make sure you discuss any questions you have with your health care provider.   Document Released: 07/17/2010 Document Revised: 01/22/2014 Document Reviewed: 12/03/2012 Elsevier Interactive Patient Education [employer].

## 2015-06-06 ENCOUNTER — Telehealth: Payer: Self-pay | Admitting: Obstetrics and Gynecology

## 2015-06-06 NOTE — Telephone Encounter (Signed)
Left detailed referral voicemail on both of patients contact numbers per most recent dpr.   [facility] [address] [place], [place] [address] [phone]  06/08/15 arrive at 840am Dr Marcine Matar

## 2015-06-07 NOTE — Telephone Encounter (Signed)
Patient is returning a call to [place]. She also is calling about scheduling an ultrasound appointment.

## 2015-06-08 DIAGNOSIS — L280 Lichen simplex chronicus: Secondary | ICD-10-CM | POA: Diagnosis not present

## 2015-06-08 DIAGNOSIS — L304 Erythema intertrigo: Secondary | ICD-10-CM | POA: Diagnosis not present

## 2015-06-16 ENCOUNTER — Encounter: Payer: Self-pay | Admitting: Obstetrics and Gynecology

## 2015-06-16 ENCOUNTER — Ambulatory Visit (INDEPENDENT_AMBULATORY_CARE_PROVIDER_SITE_OTHER): Payer: Medicare Other | Admitting: Obstetrics and Gynecology

## 2015-06-16 ENCOUNTER — Ambulatory Visit (INDEPENDENT_AMBULATORY_CARE_PROVIDER_SITE_OTHER): Payer: Medicare Other

## 2015-06-16 VITALS — Ht 63.5 in

## 2015-06-16 DIAGNOSIS — R938 Abnormal findings on diagnostic imaging of other specified body structures: Secondary | ICD-10-CM | POA: Diagnosis not present

## 2015-06-16 DIAGNOSIS — D252 Subserosal leiomyoma of uterus: Secondary | ICD-10-CM

## 2015-06-16 DIAGNOSIS — D259 Leiomyoma of uterus, unspecified: Secondary | ICD-10-CM | POA: Diagnosis not present

## 2015-06-16 DIAGNOSIS — R9389 Abnormal findings on diagnostic imaging of other specified body structures: Secondary | ICD-10-CM

## 2015-06-16 NOTE — Progress Notes (Signed)
GYNECOLOGY  VISIT   HPI: 65 y.o.   Married  Caucasian  female   G0P0000 with Patient's last menstrual period was 01/15/2002.   here for   Ultrasound.  Has hx of 10 cm uterine fibroid.  Uterus enlarged on routine pelvic exam.   Denies any postmenopausal bleeding.   Has poison ivy on her wrists. Saw dermatology.  Using steroid cream.   GYNECOLOGIC HISTORY: Patient's last menstrual period was 01/15/2002.  Pap: 06-02-14 Neg(HR HPV neg 09/2011) History of abnormal Pap: no MMG: 07-15-14 3D/Density A/Neg/BiRads1:[facility].         OB History    Gravida Para Term Preterm AB TAB SAB Ectopic Multiple Living   0 0 0 0 0 0 0 0 0 0          Patient Active Problem List   Diagnosis Date Noted  . 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 Hypertension   . Thyroid disease     hypothyroidism  . Rectal pain   . 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 Fibroids   . Fistula     benign-rectal    Past Surgical History  Procedure Laterality Date  . Cholecystectomy  10/79  . Cataract extraction Bilateral 3/08, 6/09  . Hysteroscopy  6/05    removal of imbedded IUD    Current Outpatient Prescriptions  Medication Sig Dispense Refill  . Calcium Carb-Cholecalciferol (CALCIUM 600+D3) 600-200 MG-UNIT TABS Take 1 tablet by mouth daily.    . Cholecalciferol (VITAMIN D PO) Take 1,000 Int'l Units by mouth daily.     . ciclopirox (LOPROX) 0.77 % cream Apply 1 application topically 2 (two) times daily.    . clobetasol ointment (TEMOVATE) AB-123456789 % Apply 1 application topically 2 (two) times daily.  0  . fluconazole (DIFLUCAN) 150 MG tablet Take 1 tablet (150 mg total) by mouth once. Take one tablet.  Repeat in 72 hours if symptoms are not completely resolved. 2 tablet 0  . levothyroxine (SYNTHROID, LEVOTHROID) 175 MCG tablet Take 1 tablet by mouth daily.  4  . metoprolol succinate (TOPROL-XL) 25 MG 24 hr tablet Take 1 tablet by mouth daily.  4  .  Multiple Vitamins-Minerals (MULTIVITAMIN PO) Take by mouth daily.    Marland Kitchen nystatin (MYCOSTATIN) powder Apply topically 3 (three) times daily. Apply to affected area for up to 7 days 15 g 2  . triamcinolone cream (KENALOG) 0.1 % Apply 1 application topically as needed.  0   No current facility-administered medications for this visit.     ALLERGIES: Hctz and Sulfa drugs cross reactors  Family History  Problem Relation Age of Onset  . Cancer Mother     lung    Social History   Social History  . Marital Status: Married    Spouse Name: N/A  . Number of Children: N/A  . Years of Education: N/A   Occupational History  . Not on file.   Social History Main Topics  . Smoking status: Never Smoker   . Smokeless tobacco: Never Used  . Alcohol Use: 4.2 - 6.0 oz/week    7-10 Glasses of wine per week     Comment: 0000000 alcoholic beverages/week(beer,wine or liquor)  . Drug Use: No  . Sexual Activity:    Partners: Male    Birth Control/ Protection: Post-menopausal   Other Topics Concern  . Not on file   Social History Narrative    ROS:  Pertinent items are  noted in HPI.  PHYSICAL EXAMINATION:    Ht 5' 3.5" (1.613 m)  LMP 01/15/2002     General appearance: alert, cooperative and appears stated age     Ultrasound images and report reviewed with the patient.  Uterus - 84 x 94 mm subserosal anterior fibroid. EMS - 12.23mm on transvaginal scan.   4.7 mm on transabdominal scan. Right ovary normal.  Left ovary not seen on transvaginal scan.  No free fluid.  ASSESSMENT  Stable uterine fibroid.  Thickened endometrium.  PLAN  Discussion of uterine fibroids and endometrial thickening.  Based on the discrepancy of the endometrial lining measurements and the possibility of endometrial pathology such as endometrial cancer, endometrial biopsy is recommended.  Patient will return for this appointment.  The procedure has been explained.    An After Visit Summary was printed and  given to the patient.  __15____ minutes face to face time of which over 50% was spent in counseling.

## 2015-06-16 NOTE — Patient Instructions (Signed)
Endometrial Biopsy Endometrial biopsy is a procedure in which a tissue sample is taken from inside the uterus. The tissue sample is then looked at under a microscope to see if the tissue is normal or abnormal. The endometrium is the lining of the uterus. This procedure helps determine where you are in your menstrual cycle and how hormone levels are affecting the lining of the uterus. This procedure may also be used to evaluate uterine bleeding or to diagnose endometrial cancer, tuberculosis, polyps, or inflammatory conditions.  LET YOUR HEALTH CARE PROVIDER KNOW ABOUT:  Any allergies you have.  All medicines you are taking, including vitamins, herbs, eye drops, creams, and over-the-counter medicines.  Previous problems you or members of your family have had with the use of anesthetics.  Any blood disorders you have.  Previous surgeries you have had.  Medical conditions you have.  Possibility of pregnancy. RISKS AND COMPLICATIONS Generally, this is a safe procedure. However, as with any procedure, complications can occur. Possible complications include:  Bleeding.  Pelvic infection.  Puncture of the uterine wall with the biopsy device (rare). BEFORE THE PROCEDURE   Keep a record of your menstrual cycles as directed by your health care provider. You may need to schedule your procedure for a specific time in your cycle.  You may want to bring a sanitary pad to wear home after the procedure.  Arrange for someone to drive you home after the procedure if you will be given a medicine to help you relax (sedative). PROCEDURE   You may be given a sedative to relax you.  You will lie on an exam table with your feet and legs supported as in a pelvic exam.  Your health care provider will insert an instrument (speculum) into your vagina to see your cervix.  Your cervix will be cleansed with an antiseptic solution. A medicine (local anesthetic) will be used to numb the cervix.  A forceps  instrument (tenaculum) will be used to hold your cervix steady for the biopsy.  A thin, rodlike instrument (uterine sound) will be inserted through your cervix to determine the length of your uterus and the location where the biopsy sample will be removed.  A thin, flexible tube (catheter) will be inserted through your cervix and into the uterus. The catheter is used to collect the biopsy sample from your endometrial tissue.  The catheter and speculum will then be removed, and the tissue sample will be sent to a lab for examination. AFTER THE PROCEDURE  You will rest in a recovery area until you are ready to go home.  You may have mild cramping and a small amount of vaginal bleeding for a few days after the procedure. This is normal.  Make sure you find out how to get your test results.   This information is not intended to replace advice given to you by your health care provider. Make sure you discuss any questions you have with your health care provider.   Document Released: 05/04/2004 Document Revised: 09/03/2012 Document Reviewed: 06/18/2012 Elsevier Interactive Patient Education 2016 [employer] Inc[employer]  

## 2015-06-18 DIAGNOSIS — R9389 Abnormal findings on diagnostic imaging of other specified body structures: Secondary | ICD-10-CM | POA: Insufficient documentation

## 2015-07-13 ENCOUNTER — Ambulatory Visit (INDEPENDENT_AMBULATORY_CARE_PROVIDER_SITE_OTHER): Payer: Medicare Other | Admitting: Obstetrics and Gynecology

## 2015-07-13 ENCOUNTER — Encounter: Payer: Self-pay | Admitting: Obstetrics and Gynecology

## 2015-07-13 VITALS — BP 138/84 | HR 84 | Resp 16 | Ht 63.5 in | Wt 193.6 lb

## 2015-07-13 DIAGNOSIS — D259 Leiomyoma of uterus, unspecified: Secondary | ICD-10-CM | POA: Diagnosis not present

## 2015-07-13 DIAGNOSIS — R938 Abnormal findings on diagnostic imaging of other specified body structures: Secondary | ICD-10-CM

## 2015-07-13 DIAGNOSIS — R9389 Abnormal findings on diagnostic imaging of other specified body structures: Secondary | ICD-10-CM

## 2015-07-13 NOTE — Progress Notes (Signed)
Patient ID: Kimberly Peck, female   DOB: 1950/09/30, 65 y.o.   MRN: [id number] GYNECOLOGY  VISIT   HPI: 65 y.o.   Married  Caucasian  female   G0P0000 with Patient's last menstrual period was 01/15/2002.   here for endometrial biopsy.     Had pelvic ultrasound 06/16/15:  Uterus - 84 x 94 mm subserosal anterior fibroid. EMS - 12.80mm on transvaginal scan. 4.7 mm on transabdominal scan. Right ovary normal.  Left ovary not seen on transvaginal scan.  No free fluid.  No postmenopausal bleeding.  Not on HRT.   GYNECOLOGIC HISTORY: Patient's last menstrual period was 01/15/2002. Contraception:  Postmenopausal Menopausal hormone therapy:  none Last mammogram:  07-15-14 3D/Density A/Neg/BiRads1:[facility] Last pap smear:   06-02-14 Neg        OB History    Gravida Para Term Preterm AB TAB SAB Ectopic Multiple Living   0 0 0 0 0 0 0 0 0 0          Patient Active Problem List   Diagnosis Date Noted  . Endometrial thickening on ultra sound 06/18/2015  . 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 Hypertension   . Thyroid disease     hypothyroidism  . Rectal pain   . 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 Fibroids   . Fistula     benign-rectal    Past Surgical History  Procedure Laterality Date  . Cholecystectomy  10/79  . Cataract extraction Bilateral 3/08, 6/09  . Hysteroscopy  6/05    removal of imbedded IUD    Current Outpatient Prescriptions  Medication Sig Dispense Refill  . Calcium Carb-Cholecalciferol (CALCIUM 600+D3) 600-200 MG-UNIT TABS Take 1 tablet by mouth daily.    . Cholecalciferol (VITAMIN D PO) Take 1,000 Int'l Units by mouth daily.     . ciclopirox (LOPROX) 0.77 % cream Apply 1 application topically 2 (two) times daily.    Marland Kitchen levothyroxine (SYNTHROID, LEVOTHROID) 175 MCG tablet Take 1 tablet by mouth daily.  4  . metoprolol succinate (TOPROL-XL) 25 MG 24 hr tablet Take 1 tablet by mouth daily.  4  .  Multiple Vitamins-Minerals (MULTIVITAMIN PO) Take by mouth daily.     No current facility-administered medications for this visit.     ALLERGIES: Hctz and Sulfa drugs cross reactors  Family History  Problem Relation Age of Onset  . Cancer Mother     lung    Social History   Social History  . Marital Status: Married    Spouse Name: N/A  . Number of Children: N/A  . Years of Education: N/A   Occupational History  . Not on file.   Social History Main Topics  . Smoking status: Never Smoker   . Smokeless tobacco: Never Used  . Alcohol Use: 4.2 - 6.0 oz/week    7-10 Glasses of wine per week     Comment: 0000000 alcoholic beverages/week(beer,wine or liquor)  . Drug Use: No  . Sexual Activity:    Partners: Male    Birth Control/ Protection: Post-menopausal   Other Topics Concern  . Not on file   Social History Narrative    ROS:  Pertinent items are noted in HPI.  PHYSICAL EXAMINATION:    BP 138/84 mmHg  Pulse 84  Resp 16  Ht 5' 3.5" (1.613 m)  Wt 193 lb 9.6 oz (87.816 kg)  BMI 33.75 kg/m2  LMP 01/15/2002    General appearance:  alert, cooperative and appears stated age   Pelvic: External genitalia:  no lesions              Urethra:  normal appearing urethra with no masses, tenderness or lesions              Bartholins and Skenes: normal                 Vagina: normal appearing vagina with normal color and discharge, no lesions              Cervix: no lesions and very difficult to reach with speculum.  No lesions.             Bimanual Exam:  Uterus:  enlarged, 15 weeks size              Adnexa: no mass, fullness, tenderness and Not palpated separately from the uterus.            Attempted EMB.  Speculums placed.  Cervix difficult to visualize.  Patient did not tolerate the speculum exam well.  Procedure aborted.   Chaperone was present for exam.  ASSESSMENT  Thickened endometrium.  Large uterine fibroid.  Inability to do office EMB.  PLAN  Will proceed  with a follow up ultrasound at [facility] or [facility] to reassess the endometrium.  If appears to still be thickened, may need hysteroscopy with dilation and curettage versus hysterectomy.    An After Visit Summary was printed and given to the patient.  _15_____ minutes face to face time of which over 50% was spent in counseling.

## 2015-07-18 DIAGNOSIS — Z1231 Encounter for screening mammogram for malignant neoplasm of breast: Secondary | ICD-10-CM | POA: Diagnosis not present

## 2015-07-25 ENCOUNTER — Ambulatory Visit (HOSPITAL_COMMUNITY)
Admission: RE | Admit: 2015-07-25 | Discharge: 2015-07-25 | Disposition: A | Discharge status: Home / Self Care | Payer: Medicare Other | Source: Ambulatory Visit | Attending: Obstetrics and Gynecology | Admitting: Obstetrics and Gynecology

## 2015-07-25 ENCOUNTER — Encounter: Payer: Self-pay | Admitting: Obstetrics and Gynecology

## 2015-07-25 ENCOUNTER — Telehealth: Payer: Self-pay

## 2015-07-25 DIAGNOSIS — D259 Leiomyoma of uterus, unspecified: Secondary | ICD-10-CM | POA: Insufficient documentation

## 2015-07-25 DIAGNOSIS — R938 Abnormal findings on diagnostic imaging of other specified body structures: Secondary | ICD-10-CM | POA: Insufficient documentation

## 2015-07-25 DIAGNOSIS — R9389 Abnormal findings on diagnostic imaging of other specified body structures: Secondary | ICD-10-CM

## 2015-07-25 NOTE — Telephone Encounter (Signed)
Left message to call Kaitlyn at [phone]. 

## 2015-07-25 NOTE — Telephone Encounter (Signed)
Spoke with patient. Advised of message as seen below from [place]. She is agreeable and verbalizes understanding. Appointment scheduled for 08/03/2015 at 1 pm with Dr.Silva. She is agreeable to date and time.  Routing to provider for final review. Patient agreeable to disposition. Will close encounter.

## 2015-07-25 NOTE — Telephone Encounter (Signed)
-----   Message from Nunzio Cobbs, MD sent at 07/25/2015  1:15 PM EDT ----- Please report ultrasound results to the patient.  Her EMS could not be measured.  Fibroid is still present.  Ovaries not visualized. I recommend office follow up for discussion of hysteroscopy with dilation and curettage.   Cc- Marisa Sprinkles

## 2015-08-03 ENCOUNTER — Encounter: Payer: Self-pay | Admitting: Obstetrics and Gynecology

## 2015-08-03 ENCOUNTER — Ambulatory Visit (INDEPENDENT_AMBULATORY_CARE_PROVIDER_SITE_OTHER): Payer: Medicare Other | Admitting: Obstetrics and Gynecology

## 2015-08-03 VITALS — BP 118/86 | HR 82 | Resp 16 | Ht 63.5 in | Wt 194.0 lb

## 2015-08-03 DIAGNOSIS — D259 Leiomyoma of uterus, unspecified: Secondary | ICD-10-CM | POA: Diagnosis not present

## 2015-08-03 DIAGNOSIS — R938 Abnormal findings on diagnostic imaging of other specified body structures: Secondary | ICD-10-CM

## 2015-08-03 DIAGNOSIS — R9389 Abnormal findings on diagnostic imaging of other specified body structures: Secondary | ICD-10-CM

## 2015-08-03 NOTE — Progress Notes (Signed)
GYNECOLOGY  VISIT   HPI: 65 y.o.   Married  Caucasian  female   G0P0000 with Patient's last menstrual period was 01/15/2002.   here for  Surgery options consult  -hysteroscopy with dilation and curettage versus hysterectomy due to Thickened Endometrium.   Had pelvic ultrasound 06/16/15:  Uterus - 84 x 94 mm subserosal anterior fibroid. EMS - 12.20mm on transvaginal scan. 4.7 mm on transabdominal scan. Right ovary normal.  Left ovary not seen on transvaginal scan.  No free fluid.  Additional ultrasound on 07/25/15 done at [facility] to evaluate the endometrium was not successful to meet this goal.  No postmenopausal bleeding.  Not on HRT.   Unable to do do EMB due to difficulty tolerating speculum exam and inability to see cervix.   GYNECOLOGIC HISTORY: Patient's last menstrual period was 01/15/2002. Contraception:  Post Menopausal  Menopausal hormone therapy:  None  Last mammogram:  07/18/15 BIRADS1:neg Last pap smear:   06/02/14 Neg        OB History    Gravida Para Term Preterm AB TAB SAB Ectopic Multiple Living   0 0 0 0 0 0 0 0 0 0          Patient Active Problem List   Diagnosis Date Noted  . Endometrial thickening on ultra sound 06/18/2015  . 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 Hypertension   . Thyroid disease     hypothyroidism  . Rectal pain   . 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 Fibroids   . Fistula     benign-rectal    Past Surgical History  Procedure Laterality Date  . Cholecystectomy  10/79  . Cataract extraction Bilateral 3/08, 6/09  . Hysteroscopy  6/05    removal of imbedded IUD    Current Outpatient Prescriptions  Medication Sig Dispense Refill  . Calcium Carb-Cholecalciferol (CALCIUM 600+D3) 600-200 MG-UNIT TABS Take 1 tablet by mouth daily.    . Cholecalciferol (VITAMIN D PO) Take 1,000 Int'l Units by mouth daily.     Marland Kitchen levothyroxine (SYNTHROID, LEVOTHROID) 175 MCG  tablet Take 1 tablet by mouth daily.  4  . metoprolol succinate (TOPROL-XL) 25 MG 24 hr tablet Take 1 tablet by mouth daily.  4  . Multiple Vitamins-Minerals (MULTIVITAMIN PO) Take by mouth daily.     No current facility-administered medications for this visit.     ALLERGIES: Hctz and Sulfa drugs cross reactors  Family History  Problem Relation Age of Onset  . Cancer Mother     lung    Social History   Social History  . Marital Status: Married    Spouse Name: N/A  . Number of Children: N/A  . Years of Education: N/A   Occupational History  . Not on file.   Social History Main Topics  . Smoking status: Never Smoker   . Smokeless tobacco: Never Used  . Alcohol Use: 4.2 - 6.0 oz/week    7-10 Glasses of wine per week     Comment: 0000000 alcoholic beverages/week(beer,wine or liquor)  . Drug Use: No  . Sexual Activity:    Partners: Male    Birth Control/ Protection: Post-menopausal   Other Topics Concern  . Not on file   Social History Narrative    ROS:  Pertinent items are noted in HPI.  PHYSICAL EXAMINATION:    BP 118/86 mmHg  Pulse 82  Resp 16  Ht 5' 3.5" (1.613 m)  Wt 194 lb (87.998 kg)  BMI 33.82 kg/m2  LMP 01/15/2002    General appearance: alert, cooperative and appears stated age   ASSESSMENT  Endometrial thickening.  Unable to do office sampling.  Large uterine fibroid.  PLAN  Discussion of endometrial thickening.  Discussion of hysteroscopy with Myosure polypectomy, dilation and curettage.  Risks, benefits, and alternatives reviewed. Risks include but are not limited to bleeding, infection, damage to surrounding organs, pulmonary edema, reaction to anesthesia, DVT, PE, death, need for further treatment and surgery including repeat hysteroscopy, hysterectomy or medical therapy.   The possibility of negative findings and the inability to get into the endometrial cavity also reviewed.  Ultrasound guided procedure may be helpful.  Surgical expectations  and recovery discussed.  Patient wishes to think more about proceeding with surgery but believes she will do this.  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  Return for preop visit.  ACOG handouts on hysteroscopy and dilation and curettage to patient.     An After Visit Summary was printed and given to the patient.  ___15___ minutes face to face time of which over 50% was spent in counseling.

## 2015-08-09 ENCOUNTER — Telehealth: Payer: Self-pay | Admitting: *Deleted

## 2015-08-09 NOTE — Telephone Encounter (Signed)
Call to patient. Left message to call back on home and cell numbers. Calling to schedule surgery.

## 2015-08-09 NOTE — Telephone Encounter (Signed)
Patient returned call. States she discussed possible date of 08-23-15 with Dr Quincy Simmonds and is still agreeable to this date. States she needs to speak with hospital business office before proceeding. Phone number to pre-services center given. Patient will call back when ready to proceed.

## 2015-08-11 NOTE — Telephone Encounter (Signed)
Return call from patient. Patient states she still has some unresolved concerns regarding her procedure that she wants to discuss with Dr Quincy Simmonds. She also wants to discuss possibility of proceeding with hysterectomy instead of hysteroscopy. Appointment scheduled with Dr Quincy Simmonds for 08-17-15.  Routing to provider for final review. Patient agreeable to disposition. Will close encounter.

## 2015-08-17 ENCOUNTER — Ambulatory Visit (INDEPENDENT_AMBULATORY_CARE_PROVIDER_SITE_OTHER): Payer: Medicare Other | Admitting: Obstetrics and Gynecology

## 2015-08-17 ENCOUNTER — Encounter: Payer: Self-pay | Admitting: Obstetrics and Gynecology

## 2015-08-17 VITALS — BP 140/84 | HR 60 | Ht 63.5 in | Wt 195.0 lb

## 2015-08-17 DIAGNOSIS — R938 Abnormal findings on diagnostic imaging of other specified body structures: Secondary | ICD-10-CM

## 2015-08-17 DIAGNOSIS — R9389 Abnormal findings on diagnostic imaging of other specified body structures: Secondary | ICD-10-CM

## 2015-08-17 DIAGNOSIS — D259 Leiomyoma of uterus, unspecified: Secondary | ICD-10-CM

## 2015-08-17 NOTE — Progress Notes (Signed)
GYNECOLOGY  VISIT   HPI: 65 y.o.   Married  Caucasian  female   G0P0000 with Patient's last menstrual period was 01/15/2002.   here for consult. Patient  Has questions regarding surgery.   Would like to consider hysterectomy instead of hysteroscopy with dilation and curettage and then potential hysterectomy to remove the uterus at a later date.  Does not want to do more than one surgery if possible.  Had pelvic ultrasound on 06/16/15 for pelvic mass noted on physical exam at time of her routine GYN visit.  Images showed a large uterine fibroid 7.79 x 8.35 x 9.43 cm and an endometrium of 12.31 mm on transvaginal ultrasound and 4.7 mm measurement on transabdominal ultrasound. Ovary normal on the right.  Left ovary normal.   Attempted EMB on 07/13/15 was aborted due to cervical stenosis and possibly the presence of the uterine fibroid affecting ability to get into the endometrial canal.  Follow up hospital ultrasound at the hospital on 07/25/15 was unable to measure the endometrium as a recheck of the lining.  It did confirm the presence of the large fibroid.  Patient has a CT scan from 2012 documenting the presence of the large fibroid which is essentially unchanged since then.   Patient denies postmenopausal bleeding and is not on HRT.  GYNECOLOGIC HISTORY: Patient's last menstrual period was 01/15/2002. Contraception:  Postmenopausal Menopausal hormone therapy:  none Last mammogram:  07-18-15 3D/Density B/Neg/BiRads1:[facility] Last pap smear:   06-02-14 Neg        OB History    Gravida Para Term Preterm AB Living   0 0 0 0 0 0   SAB TAB Ectopic Multiple Live Births   0 0 0 0           Patient Active Problem List   Diagnosis Date Noted  . Endometrial thickening on ultra sound 06/18/2015  . 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 Fibroids   . Fistula    benign-rectal  . Hypertension   .  Rectal pain   . Thyroid disease    hypothyroidism    Past Surgical History:  Procedure Laterality Date  . CATARACT EXTRACTION Bilateral 3/08, 6/09  . CHOLECYSTECTOMY  10/79  . HYSTEROSCOPY  6/05   removal of imbedded IUD    Current Outpatient Prescriptions  Medication Sig Dispense Refill  . Calcium Carb-Cholecalciferol (CALCIUM 600+D3) 600-200 MG-UNIT TABS Take 1 tablet by mouth daily.    . Cholecalciferol (VITAMIN D PO) Take 1,000 Int'l Units by mouth daily.     . clobetasol ointment (TEMOVATE) AB-123456789 % Apply 1 application topically as needed.  0  . levothyroxine (SYNTHROID, LEVOTHROID) 175 MCG tablet Take 1 tablet by mouth daily.  4  . metoprolol succinate (TOPROL-XL) 25 MG 24 hr tablet Take 1 tablet by mouth daily.  4  . Multiple Vitamins-Minerals (MULTIVITAMIN PO) Take by mouth daily.    Marland Kitchen NYAMYC powder Apply 1 application topically as needed.  1   No current facility-administered medications for this visit.      ALLERGIES: Hctz [hydrochlorothiazide] and Sulfa drugs cross reactors  Family History  Problem Relation Age of Onset  . Cancer Mother     lung    Social History   Social History  . Marital status: Married    Spouse name: N/A  . Number of children: N/A  . Years of education: N/A   Occupational  History  . Not on file.   Social History Main Topics  . Smoking status: Never Smoker  . Smokeless tobacco: Never Used  . Alcohol use 4.2 - 6.0 oz/week    7 - 10 Glasses of wine per week     Comment: 0000000 alcoholic beverages/week(beer,wine or liquor)  . Drug use: No  . Sexual activity: Yes    Partners: Male    Birth control/ protection: Post-menopausal   Other Topics Concern  . Not on file   Social History Narrative  . No narrative on file    ROS:  Pertinent items are noted in HPI.  PHYSICAL EXAMINATION:    BP 140/84 (BP Location: Right Arm, Patient Position: Sitting, Cuff Size: Normal)   Pulse 60   Ht 5' 3.5" (1.613 m)   Wt 195 lb (88.5 kg)   LMP  01/15/2002   BMI 34.00 kg/m     General appearance: alert, cooperative and appears stated age   ASSESSMENT   Postmenopausal patient.  Large uterine fibroid.  Stable.  Possible thickened endometrium.  This is documented by transvaginal ultrasound.  No biopsy to date  PLAN  We discussed her large uterine fibroid and the thickened endometrium today.  We did review the possibility of uterine cancer of the endometrium and even the rare possibility of sarcoma of the fibroid.   Options for care moving forward are discussed with patient today: - Hysteroscopy with dilation and curettage and frozen section.  If frozen section shows malignancy, hysterectomy will not be performed by me and she will then be referred to [place].  If unable to obtain endometrial curettings, I can proceed forward with TAH/BSO and the patient acepts that a post op cancer diagnosis could necessitate GYN ONC consultation and potential further surgery or treatment.  - Observation.  Patient is told that her evaluation is incomplete and that she may have undiagnosed cancer for which a delay in care could cause worsening prognosis. She may observe for signs of pathology and call for postmenopausal bleeding.  She needs to accept limited pelvic exams to diagnose ovarian pathology due to the large fibroid mass.  She understands that utrasound can be used to increase the ability to monitor for adnexal changes. - Second opinion from [place].  We will call patient in about 1 week per her approval of this plan.  She would like to consider her options.     An After Visit Summary was printed and given to the patient.  __15____ minutes face to face time of which over 50% was spent in counseling.

## 2015-08-25 ENCOUNTER — Telehealth: Payer: Self-pay | Admitting: *Deleted

## 2015-08-25 NOTE — Telephone Encounter (Signed)
Follow-up call to patient. Left message to call back on home and cell numbers.

## 2015-08-25 NOTE — Telephone Encounter (Signed)
-----   Message from Nunzio Cobbs, MD sent at 08/17/2015 11:37 AM EDT ----- Regarding: please contact patient in about one week to do potential surgical planning Please contact patient about 08/24/15 to discuss potential surgical plans.   We talked about  - hysteroscopy with dilation and curettage and frozen section with TAH/BSO.  - doing nothing and calling for any postmenopausal bleeding.  - second opinion from [place].  We wants to think about her choices.  She indicated she may call before one week has passed.   Thanks,   [employer]

## 2015-09-01 NOTE — Telephone Encounter (Signed)
Call to patient. Left message to call back. (VM confirms patient's name.)

## 2015-09-05 ENCOUNTER — Encounter: Payer: Self-pay | Admitting: Obstetrics and Gynecology

## 2015-09-05 DIAGNOSIS — R9389 Abnormal findings on diagnostic imaging of other specified body structures: Secondary | ICD-10-CM | POA: Insufficient documentation

## 2015-09-05 DIAGNOSIS — D219 Benign neoplasm of connective and other soft tissue, unspecified: Secondary | ICD-10-CM | POA: Insufficient documentation

## 2015-09-05 NOTE — Telephone Encounter (Signed)
Patient has been adequately advised about potential for endometrial cancer and even rare possibility of sarcoma of the uterine fibroid.  OK to close encounter.

## 2015-09-05 NOTE — Telephone Encounter (Signed)
No response from patient. Please advise on any future action.

## 2015-10-03 DIAGNOSIS — Z0000 Encounter for general adult medical examination without abnormal findings: Secondary | ICD-10-CM | POA: Diagnosis not present

## 2015-10-03 DIAGNOSIS — I10 Essential (primary) hypertension: Secondary | ICD-10-CM | POA: Diagnosis not present

## 2015-10-03 DIAGNOSIS — E782 Mixed hyperlipidemia: Secondary | ICD-10-CM | POA: Diagnosis not present

## 2015-10-03 DIAGNOSIS — E039 Hypothyroidism, unspecified: Secondary | ICD-10-CM | POA: Diagnosis not present

## 2015-10-10 DIAGNOSIS — I10 Essential (primary) hypertension: Secondary | ICD-10-CM | POA: Diagnosis not present

## 2015-10-10 DIAGNOSIS — E782 Mixed hyperlipidemia: Secondary | ICD-10-CM | POA: Diagnosis not present

## 2015-10-10 DIAGNOSIS — E039 Hypothyroidism, unspecified: Secondary | ICD-10-CM | POA: Diagnosis not present

## 2016-01-16 DIAGNOSIS — L409 Psoriasis, unspecified: Secondary | ICD-10-CM

## 2016-01-16 HISTORY — DX: Psoriasis, unspecified: L40.9

## 2016-06-06 ENCOUNTER — Encounter: Payer: Self-pay | Admitting: Obstetrics and Gynecology

## 2016-06-06 ENCOUNTER — Ambulatory Visit (INDEPENDENT_AMBULATORY_CARE_PROVIDER_SITE_OTHER): Payer: Medicare Other | Admitting: Obstetrics and Gynecology

## 2016-06-06 ENCOUNTER — Other Ambulatory Visit (HOSPITAL_COMMUNITY)
Admission: RE | Admit: 2016-06-06 | Discharge: 2016-06-06 | Disposition: A | Discharge status: Home / Self Care | Payer: Medicare Other | Source: Ambulatory Visit | Attending: Obstetrics and Gynecology | Admitting: Obstetrics and Gynecology

## 2016-06-06 VITALS — BP 116/70 | HR 68 | Resp 16 | Ht 63.5 in | Wt 192.0 lb

## 2016-06-06 DIAGNOSIS — Z124 Encounter for screening for malignant neoplasm of cervix: Secondary | ICD-10-CM

## 2016-06-06 DIAGNOSIS — R938 Abnormal findings on diagnostic imaging of other specified body structures: Secondary | ICD-10-CM

## 2016-06-06 DIAGNOSIS — D259 Leiomyoma of uterus, unspecified: Secondary | ICD-10-CM

## 2016-06-06 DIAGNOSIS — R21 Rash and other nonspecific skin eruption: Secondary | ICD-10-CM | POA: Diagnosis not present

## 2016-06-06 DIAGNOSIS — Z01419 Encounter for gynecological examination (general) (routine) without abnormal findings: Secondary | ICD-10-CM

## 2016-06-06 DIAGNOSIS — R9389 Abnormal findings on diagnostic imaging of other specified body structures: Secondary | ICD-10-CM

## 2016-06-06 MED ORDER — NYSTATIN 100000 UNIT/GM EX POWD: Freq: Three times a day (TID) | CUTANEOUS | 2 refills | Status: DC

## 2016-06-06 NOTE — Progress Notes (Signed)
66 y.o. [place] Married Caucasian female here for annual exam.    Has thickened endometrium and a large 9 cm uterine fibroid.  The fibroid has been confirmed by both prior CT and pelvic ultrasound.  She did not have successful endometrial sampling due to cervical stenosis.  Patient had chosen not to proceed forward with surgical care.  She does wonder if the fibroid is larger now.  Denies any pain or bleeding.   Hx chronic yeast infections or skin rash on her lower abdomen and lower back skin.  Labs with PCP.   PCP:   Dr. Ashby Dawes [facility]  Patient's last menstrual period was 01/15/2002.           Sexually active: Yes.    The current method of family planning is post menopausal status.    Exercising: Yes.    aerobics, weights, core Smoker:  no  Health Maintenance: Pap:  06-02-14 Neg(HR HPV neg 09/2011) History of abnormal Pap:  no MMG:  07/18/15 BIRADS 1 negative Colonoscopy:  2009 normal;next due 2019 BMD:   06/2011  Result  -1.7/-1.7:[facility]. 2016 - mild osteopenia. [employer]. TDaP: about 2007 per patient Gardasil:   n/a HIV: unsure Hep C: unsure Screening Labs:  Hb today: PCP takes care of labs   reports that she has never smoked. She has never used smokeless tobacco. She reports that she drinks about 4.2 - 6.0 oz of alcohol per week . She reports that she does not use drugs.  Past Medical History:  Diagnosis Date  . 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 Fibroids    large uterine fibroid  . Fistula    benign-rectal  . Hypertension   . Rectal pain   . Thickened endometrium    Unsuccessful endometrial sampling through office [facility].    . Thyroid disease    hypothyroidism    Past Surgical History:  Procedure Laterality Date  . CATARACT EXTRACTION Bilateral 3/08, 6/09  . CHOLECYSTECTOMY  10/79  . HYSTEROSCOPY  6/05   removal of imbedded IUD    Current Outpatient Prescriptions  Medication Sig Dispense Refill  .  Calcium Carb-Cholecalciferol (CALCIUM 600+D3) 600-200 MG-UNIT TABS Take 1 tablet by mouth daily.    . Cholecalciferol (VITAMIN D PO) Take 1,000 Int'l Units by mouth daily.     Marland Kitchen levothyroxine (SYNTHROID, LEVOTHROID) 175 MCG tablet Take 1 tablet by mouth daily.  4  . metoprolol succinate (TOPROL-XL) 25 MG 24 hr tablet Take 1 tablet by mouth daily.  4  . Multiple Vitamins-Minerals (MULTIVITAMIN PO) Take by mouth daily.     No current facility-administered medications for this visit.     Family History  Problem Relation Age of Onset  . Cancer Mother        lung    ROS:  Pertinent items are noted in HPI.  Otherwise, a comprehensive ROS was negative.  Exam:   BP 116/70 (BP Location: Right Arm, Patient Position: Sitting, Cuff Size: Normal)   Pulse 68   Resp 16   Ht 5' 3.5" (1.613 m)   Wt 192 lb (87.1 kg)   LMP 01/15/2002   BMI 33.48 kg/m     General appearance: alert, cooperative and appears stated age Head: Normocephalic, without obvious abnormality, atraumatic Neck: no adenopathy, supple, symmetrical, trachea midline and thyroid normal to inspection and palpation Lungs: clear to auscultation bilaterally Breasts: normal appearance, no masses or tenderness, No nipple retraction or dimpling, No nipple discharge or bleeding, No  axillary or supraclavicular adenopathy Heart: regular rate and rhythm Abdomen: obese, large ill defined lower abdominal mass, nontender, soft, non-tender. Extremities: extremities normal, atraumatic, no cyanosis or edema Skin: Skin color, texture, turgor normal.  Excoriated rash of the lower back and under pannus.  Lymph nodes: Cervical, supraclavicular, and axillary nodes normal. No abnormal inguinal nodes palpated Neurologic: Grossly normal  Pelvic: External genitalia:  no lesions              Urethra:  normal appearing urethra with no masses, tenderness or lesions              Bartholins and Skenes: normal                 Vagina: normal appearing vagina  with normal color and discharge, no lesions              Cervix:  Not able to see.  Blind pap done.               Pap taken: Yes.   Bimanual Exam:  Uterus:  Palpable above the umbilicus, nontender.               Adnexa: no mass, fullness, tenderness              Rectal exam: Yes.  .  Confirms.              Anus:  normal sphincter tone, no lesions  Chaperone was present for exam.  Assessment:   Well woman visit with normal exam. Large uterine fibroid.  Long standing.  Increasing in size? Hx cervical stenosis.  Hx cold knife conization of cervix. Thickened endometrium with unsuccessful endometrial sampling.  Osteopenia.   Plan: Mammogram screening discussed. Recommended self breast awareness.  She will call to schedule at [facility]. Pap and HR HPV as above. Guidelines for Calcium, Vitamin D, regular exercise program including cardiovascular and weight bearing exercise. Nystatin powder.  Referral to dermatology.  Referral to GYN ONC for further consultation and treatment.   I reiterated her risk of potential malignancy and need for further care. Labs and BMD with PCP.  Follow up annually and prn.   After visit summary provided.

## 2016-06-06 NOTE — Patient Instructions (Signed)

## 2016-06-07 LAB — CYTOLOGY - PAP: DIAGNOSIS: NEGATIVE

## 2016-06-08 ENCOUNTER — Telehealth: Payer: Self-pay | Admitting: *Deleted

## 2016-06-08 NOTE — Telephone Encounter (Signed)
Left message to call Sharee Pimple at [phone].   Reviewed with Gay Filler, awaiting return call from Seth Bake at [facility] for scheduling.

## 2016-06-08 NOTE — Telephone Encounter (Signed)
Spoke with patient, advised of results and recommendations as seen below per Dr. Quincy Simmonds. Advised patient our office will return call with appointment information once scheduled. Patient verbalizes understanding and is agreeable.  Routing to provider for final review. Patient is agreeable to disposition. Will close encounter.   Cc: Kimberly Peck

## 2016-06-08 NOTE — Telephone Encounter (Signed)
-----   Message from Nunzio Cobbs, MD sent at 06/07/2016  9:04 PM EDT ----- Please report negative pap result to patient.  Recall - 02.   We are in the process of making a referral to GYN oncology for her due to a large uterine fibroid and thickened endometrium.  Cc- Lamont Snowball

## 2016-06-13 ENCOUNTER — Telehealth: Payer: Self-pay | Admitting: *Deleted

## 2016-06-13 NOTE — Telephone Encounter (Signed)
2nd call to [place] for appointment. Left message on Kimberly Peck voice mail requesting appoinntment.  Call to patient. Per ROI can leave message on voice mail. Voice mail has name confirmation. Left message updating that we have called about appointment but have not heard yet, will be in touch as soon as possible.

## 2016-06-14 ENCOUNTER — Telehealth: Payer: Self-pay

## 2016-06-14 DIAGNOSIS — R21 Rash and other nonspecific skin eruption: Secondary | ICD-10-CM

## 2016-06-14 NOTE — Telephone Encounter (Signed)
Spoke with patient at time of incoming call. Patient states that she got Sally's message regarding her appointment that is scheduled with Dr.Clarke Pearson on 06/29/2016 at 12:15 pm. Patient is agreeable to date and time. Also states that she has scheduled an appointment at [facility], but the first available appointment they had was for August. Patient states she was previously seen with Dr.Stinehelfer and is asking if she can be referred back to her to see if she can be seen earlier for her skin rash.

## 2016-06-14 NOTE — Telephone Encounter (Signed)
Call to patient, left message (per ROI) with appointment information and instructions as well as phone number. Left message to call back if questions.   Routing to provider for final review.  Will close encounter.

## 2016-06-14 NOTE — Telephone Encounter (Signed)
Seth Bake from [facility] returned your call. This patient is scheduled to see Dr.Clark-Pearson 06/29/16 @12 :15pm. If you have any questions give Seth Bake a call.

## 2016-06-14 NOTE — Telephone Encounter (Signed)
Please make the referral.  Patient needs to be seen in the next couple of weeks.

## 2016-06-15 NOTE — Telephone Encounter (Signed)
Referral has been placed to Dr.Stinehelfer. Routing to Theresia Lo for referral coordination.  Routing to provider for final review. Patient agreeable to disposition. Will close encounter.

## 2016-06-29 ENCOUNTER — Encounter: Payer: Self-pay | Admitting: Gynecology

## 2016-06-29 ENCOUNTER — Ambulatory Visit: Payer: Medicare Other | Attending: Gynecology | Admitting: Gynecology

## 2016-06-29 VITALS — BP 143/90 | HR 84 | Temp 98.0°F | Resp 20 | Ht 63.9 in | Wt 193.8 lb

## 2016-06-29 DIAGNOSIS — N858 Other specified noninflammatory disorders of uterus: Secondary | ICD-10-CM | POA: Diagnosis present

## 2016-06-29 DIAGNOSIS — D259 Leiomyoma of uterus, unspecified: Secondary | ICD-10-CM | POA: Diagnosis not present

## 2016-06-29 DIAGNOSIS — I10 Essential (primary) hypertension: Secondary | ICD-10-CM | POA: Insufficient documentation

## 2016-06-29 DIAGNOSIS — R9389 Abnormal findings on diagnostic imaging of other specified body structures: Secondary | ICD-10-CM

## 2016-06-29 DIAGNOSIS — N871 Moderate cervical dysplasia: Secondary | ICD-10-CM | POA: Diagnosis not present

## 2016-06-29 DIAGNOSIS — E039 Hypothyroidism, unspecified: Secondary | ICD-10-CM | POA: Diagnosis not present

## 2016-06-29 DIAGNOSIS — R938 Abnormal findings on diagnostic imaging of other specified body structures: Secondary | ICD-10-CM | POA: Diagnosis not present

## 2016-06-29 NOTE — Progress Notes (Signed)
Consult Note: Gyn-Onc   Kimberly Peck 66 y.o. female  Chief Complaint  Patient presents with  . Endometrial Thickening  . Fibroids    Assessment :Uterine fibroid which is asymptomatic and stable in size since September 2012.  Plan: Given that the patient has not had imaging since July 2017, we will plan on repeating an ultrasound in the near future. I'm particularly interested as to whether the size of the fibroid has changed and also an assessment of her endometrial stripe.  HPI: 66 year old white female seen in consultation request of Dr. Rolly Pancake regarding management of a presumed uterine fibroid. Review of medical records shows the patient had a CT scan in September 2012 showing a dominant uterine fibroid measuring 9.7 x 8.3 cm. Subsequently the patient's been entirely asymptomatic. She denies any pelvic pain pressure vaginal bleeding bloating or any GI or GU symptoms. The patient has had a subsequent ultrasound of the pelvis in July 2017 showing that the fibroid measures 9.8 x 9.1 x 8.4 cm (essentially stable) Dr. Quincy Simmonds attempted an endometrial biopsy but the patient apparently has a stenotic cervix and a biopsy was not obtained. Again, the patient has no pelvic symptoms. She does have a past history of some cervical and uterine manipulation to remove an embedded IUD.  Review of Systems:10 point review of systems is negative except as noted in interval history.   Vitals: Blood pressure (!) 143/90, pulse 84, temperature 98 F (36.7 C), resp. rate 20, height 5' 3.9" (1.623 m), weight 193 lb 12.8 oz (87.9 kg), last menstrual period 01/15/2002, SpO2 100 %.  Physical Exam: General : The patient is a healthy woman in no acute distress.  HEENT: normocephalic, extraoccular movements normal; neck is supple without thyromegally  Lynphnodes: Supraclavicular and inguinal nodes not enlarged  Abdomen: Soft, non-tender, no ascites, no organomegally, no masses, no hernias  Pelvic:  EGBUS:  Normal female  Vagina: Normal, no lesions  Urethra and Bladder: Normal, non-tender  Cervix: Flush with the upper vagina and stenotic Uterus: Difficult to delineate secondary to the patient's body habitus. Bi-manual examination: Non-tender; no adenxal masses or nodularity  Rectal: normal sphincter tone, no masses, no blood  Lower extremities: No edema or varicosities. Normal range of motion      Allergies  Allergen Reactions  . Hctz [Hydrochlorothiazide]   . Sulfa Drugs Cross Reactors Itching    Past Medical History:  Diagnosis Date  . 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 Fibroids    large uterine fibroid  . Fistula    benign-rectal  . Hypertension   . Rectal pain   . Thickened endometrium    Unsuccessful endometrial sampling through office [facility].    . Thyroid disease    hypothyroidism    Past Surgical History:  Procedure Laterality Date  . CATARACT EXTRACTION Bilateral 3/08, 6/09  . CHOLECYSTECTOMY  10/79  . HYSTEROSCOPY  6/05   removal of imbedded IUD    Current Outpatient Prescriptions  Medication Sig Dispense Refill  . Calcium Carb-Cholecalciferol (CALCIUM 600+D3) 600-200 MG-UNIT TABS Take 1 tablet by mouth daily.    . Cholecalciferol (VITAMIN D PO) Take 1,000 Int'l Units by mouth daily.     Marland Kitchen levothyroxine (SYNTHROID, LEVOTHROID) 175 MCG tablet Take 1 tablet by mouth daily.  4  . metoprolol succinate (TOPROL-XL) 25 MG 24 hr tablet Take 1 tablet by mouth daily.  4  . Multiple Vitamins-Minerals (MULTIVITAMIN PO) Take by mouth daily.    Marland Kitchen nystatin (MYCOSTATIN/NYSTOP) powder  Apply topically 3 (three) times daily. Apply to affected area for up to 7 days 15 g 2   No current facility-administered medications for this visit.     Social History   Social History  . Marital status: Married    Spouse name: N/A  . Number of children: N/A  . Years of education: N/A   Occupational History  . Not on file.   Social History Main Topics  . Smoking status:  Never Smoker  . Smokeless tobacco: Never Used  . Alcohol use 4.2 - 6.0 oz/week    7 - 10 Glasses of wine per week     Comment: 7-09 alcoholic beverages/week(beer,wine or liquor)  . Drug use: No  . Sexual activity: Yes    Partners: Male    Birth control/ protection: Post-menopausal   Other Topics Concern  . Not on file   Social History Narrative  . No narrative on file    Family History  Problem Relation Age of Onset  . Cancer Mother        lung      Marti Sleigh, MD 06/29/2016, 12:41 PM

## 2016-06-29 NOTE — Patient Instructions (Addendum)
We'll obtain a pelvic ultrasound contact you with results. Drink 32 ounces of water an hour prior to Ultrasound and arrive with a full bladder.

## 2016-07-05 DIAGNOSIS — L400 Psoriasis vulgaris: Secondary | ICD-10-CM | POA: Diagnosis not present

## 2016-07-11 ENCOUNTER — Ambulatory Visit (HOSPITAL_COMMUNITY)
Admission: RE | Admit: 2016-07-11 | Discharge: 2016-07-11 | Disposition: A | Discharge status: Home / Self Care | Payer: Medicare Other | Source: Ambulatory Visit | Attending: Gynecologic Oncology | Admitting: Gynecologic Oncology

## 2016-07-11 DIAGNOSIS — D259 Leiomyoma of uterus, unspecified: Secondary | ICD-10-CM | POA: Insufficient documentation

## 2016-07-11 DIAGNOSIS — R938 Abnormal findings on diagnostic imaging of other specified body structures: Secondary | ICD-10-CM | POA: Diagnosis not present

## 2016-07-11 DIAGNOSIS — R9389 Abnormal findings on diagnostic imaging of other specified body structures: Secondary | ICD-10-CM

## 2016-07-17 ENCOUNTER — Telehealth: Payer: Self-pay

## 2016-07-17 NOTE — Telephone Encounter (Signed)
Requested patient call back to the office on 07-19-16 to discuss results and Dr. Mora Bellman  recommendations

## 2016-07-19 ENCOUNTER — Telehealth: Payer: Self-pay | Admitting: *Deleted

## 2016-07-19 DIAGNOSIS — Z1231 Encounter for screening mammogram for malignant neoplasm of breast: Secondary | ICD-10-CM | POA: Diagnosis not present

## 2016-07-19 DIAGNOSIS — M8589 Other specified disorders of bone density and structure, multiple sites: Secondary | ICD-10-CM | POA: Diagnosis not present

## 2016-07-19 NOTE — Telephone Encounter (Signed)
Call to patient to discuss recommended Hysteroscopy/Myosure/D&C. Dr Fermin Schwab and Dr Quincy Simmonds have reviewed and recommend proceeding. Call to patient. Left message to call back.

## 2016-07-20 NOTE — Telephone Encounter (Signed)
Return call to patient. Voice mail confirms patient name. Left message calling to review recommendations from Dr Fermin Schwab and Dr Quincy Simmonds. Left message to call back.

## 2016-07-20 NOTE — Telephone Encounter (Signed)
Patient is returning a call to Sally. °

## 2016-07-20 NOTE — Telephone Encounter (Signed)
Pt returning call and left message. Pt frustrated that she has not reached some one in the office.

## 2016-07-23 NOTE — Telephone Encounter (Signed)
Told Kimberly Peck that Dr. Fermin Schwab said that the fibroid remains unchanged.  The endometrial stripe is > 1 cm.  He recommends that Dr. Quincy Simmonds go ahead with a D&C to evaluate the endometrium. Dr. Elza Rafter office with be in [place] with her to make an appointment. The follow up with Dr. Fermin Schwab on 07-27-16 is not necessary.  Appointment cancelled.

## 2016-07-24 ENCOUNTER — Encounter: Payer: Self-pay | Admitting: Obstetrics and Gynecology

## 2016-07-25 ENCOUNTER — Telehealth: Payer: Self-pay

## 2016-07-25 NOTE — Telephone Encounter (Signed)
Spoke with patient. Advised of BMD as seen below. Patient verbalizes understanding and is agreeable. Paper copy to scan.  Routing to provider for final review. Patient agreeable to disposition. Will close encounter.

## 2016-07-25 NOTE — Telephone Encounter (Signed)
Call to patient approximately 4pm. Left message on voice mail per ROI. Left message advising benefits for surgery had been completed. (See account notes. ) can call back to speak to [facility] if has additional questions. Will wait till appointment with Dr Quincy Simmonds on 08-08-16 to proceed with scheduling. If desires to proceed, can call back to speak to [place].

## 2016-07-25 NOTE — Telephone Encounter (Signed)
Spoke with patient. Patient verbalizes understanding and would like to proceed with Hysteroscopy/Myosure/D&C. Would like to know benefits and would like to be seen with Dr.Silva to discuss further. Appointment scheduled wit Dr.Silva for 08/08/2016 at 1 pm with Dr.Silva. Patient is agreeable to date and time.  Cc: Lamont Snowball, RN Lerry Liner  Routing to provider for final review. Patient agreeable to disposition. Will close encounter.

## 2016-07-25 NOTE — Telephone Encounter (Signed)
Left message to call [place] at [phone].  BMD results from 07/19/2016:  Per Dr.Silva patient has osteopenia of hip and spine. Fracture risk is still low enough that prescription treatment is not indicated. Weight bearing exercise, calcium 1200 mg daily in divided doses and vitamin D 800 IU daily are recommended. Repeat BMD in 2 years. Paper report on my desk.

## 2016-07-26 DIAGNOSIS — L400 Psoriasis vulgaris: Secondary | ICD-10-CM | POA: Diagnosis not present

## 2016-07-27 ENCOUNTER — Ambulatory Visit: Payer: Medicare Other | Admitting: Gynecology

## 2016-08-08 ENCOUNTER — Encounter: Payer: Self-pay | Admitting: Obstetrics and Gynecology

## 2016-08-08 ENCOUNTER — Encounter (INDEPENDENT_AMBULATORY_CARE_PROVIDER_SITE_OTHER): Payer: Medicare Other | Admitting: Obstetrics and Gynecology

## 2016-08-08 VITALS — BP 124/70 | HR 60 | Resp 16 | Wt 193.0 lb

## 2016-08-08 DIAGNOSIS — Z5321 Procedure and treatment not carried out due to patient leaving prior to being seen by health care provider: Secondary | ICD-10-CM

## 2016-08-08 NOTE — Progress Notes (Signed)
GYNECOLOGY  VISIT   HPI: 66 y.o.   Married  Caucasian  female   [id number] with Patient's last menstrual period was 01/15/2002.   here for  consult   GYNECOLOGIC HISTORY: Patient's last menstrual period was 01/15/2002. Contraception:  Postmenopausal Menopausal hormone therapy:  none Last mammogram:  07/19/16 BIRADS 1 negative Last pap smear:  06/06/16 Pap Negative    06-02-14 Neg(HR HPV neg 09/2011)        OB History    Gravida Para Term Preterm AB Living   0 0 0 0 0 0   SAB TAB Ectopic Multiple Live Births   0 0 0 0           Patient Active Problem List   Diagnosis Date Noted  . Thickened endometrium   . Fibroids   . Endometrial thickening on ultra sound 06/18/2015  . 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 Fibroids    large uterine fibroid  . Fistula    benign-rectal  . Hypertension   . Psoriasis 2018  . Rectal pain   . Thickened endometrium    Unsuccessful endometrial sampling through office [facility].    . Thyroid disease    hypothyroidism    Past Surgical History:  Procedure Laterality Date  . CATARACT EXTRACTION Bilateral 3/08, 6/09  . CHOLECYSTECTOMY  10/79  . HYSTEROSCOPY  6/05   removal of imbedded IUD    Current Outpatient Prescriptions  Medication Sig Dispense Refill  . Calcium Carb-Cholecalciferol (CALCIUM 600+D3) 600-200 MG-UNIT TABS Take 1 tablet by mouth daily.    . Cholecalciferol (VITAMIN D PO) Take 1,000 Int'l Units by mouth daily.     Marland Kitchen levothyroxine (SYNTHROID, LEVOTHROID) 175 MCG tablet Take 1 tablet by mouth daily.  4  . metoprolol succinate (TOPROL-XL) 25 MG 24 hr tablet Take 1 tablet by mouth daily.  4  . Multiple Vitamins-Minerals (MULTIVITAMIN PO) Take by mouth daily.     No current facility-administered medications for this visit.      ALLERGIES: Hctz [hydrochlorothiazide] and Sulfa drugs cross reactors  Family History  Problem Relation Age  of Onset  . Cancer Mother        lung    Social History   Social History  . Marital status: Married    Spouse name: N/A  . Number of children: N/A  . Years of education: N/A   Occupational History  . Not on file.   Social History Main Topics  . Smoking status: Never Smoker  . Smokeless tobacco: Never Used  . Alcohol use 4.2 - 6.0 oz/week    7 - 10 Glasses of wine per week     Comment: 2-99 alcoholic beverages/week(beer,wine or liquor)  . Drug use: No  . Sexual activity: Yes    Partners: Male    Birth control/ protection: Post-menopausal   Other Topics Concern  . Not on file   Social History Narrative  . No narrative on file    ROS:  Pertinent items are noted in HPI.  PHYSICAL EXAMINATION:    BP 124/70 (BP Location: Right Arm, Patient Position: Sitting, Cuff Size: Normal)   Pulse 60   Resp 16   Wt 193 lb (87.5 kg)   LMP 01/15/2002   BMI 33.23 kg/m       No exam.  ASSESSMENT    PLAN  Patient left without being seen.  There was an emergency  patient which delayed this patient to be seen.   An After Visit Summary was printed and given to the patient.  ______ minutes face to face time of which over 50% was spent in counseling.

## 2016-09-24 ENCOUNTER — Telehealth: Payer: Self-pay | Admitting: *Deleted

## 2016-09-24 NOTE — Telephone Encounter (Signed)
Follow-up call to patient: previous appointment scheduled on 08-08-16. Patient left without being seen after delay by provider (office emergency).  Practice administrator attempted to contact patient without success.  Call to patient on home and cell number. Left message to call back.

## 2016-09-26 NOTE — Telephone Encounter (Signed)
Follow-up call to patient to reschedule appointment. Left message to call back.

## 2016-10-15 DIAGNOSIS — Z780 Asymptomatic menopausal state: Secondary | ICD-10-CM | POA: Diagnosis not present

## 2016-10-15 DIAGNOSIS — I10 Essential (primary) hypertension: Secondary | ICD-10-CM | POA: Diagnosis not present

## 2016-10-15 DIAGNOSIS — Z0000 Encounter for general adult medical examination without abnormal findings: Secondary | ICD-10-CM | POA: Diagnosis not present

## 2016-10-15 DIAGNOSIS — E039 Hypothyroidism, unspecified: Secondary | ICD-10-CM | POA: Diagnosis not present

## 2016-10-15 DIAGNOSIS — E782 Mixed hyperlipidemia: Secondary | ICD-10-CM | POA: Diagnosis not present

## 2016-10-17 ENCOUNTER — Encounter: Payer: Self-pay | Admitting: *Deleted

## 2016-10-17 NOTE — Telephone Encounter (Signed)
No response from patient. Letter to your office for review.  

## 2016-10-22 DIAGNOSIS — E782 Mixed hyperlipidemia: Secondary | ICD-10-CM | POA: Diagnosis not present

## 2016-10-22 DIAGNOSIS — I8393 Asymptomatic varicose veins of bilateral lower extremities: Secondary | ICD-10-CM | POA: Diagnosis not present

## 2016-10-22 DIAGNOSIS — I10 Essential (primary) hypertension: Secondary | ICD-10-CM | POA: Diagnosis not present

## 2016-10-22 DIAGNOSIS — E039 Hypothyroidism, unspecified: Secondary | ICD-10-CM | POA: Diagnosis not present

## 2016-10-24 NOTE — Telephone Encounter (Signed)
Letter reviewed and signed by Dr Quincy Simmonds. Mailed certified and regular [place] mail.  Encounter closed.

## 2017-06-12 ENCOUNTER — Ambulatory Visit: Payer: Medicare Other | Admitting: Obstetrics and Gynecology

## 2017-07-29 DIAGNOSIS — Z1231 Encounter for screening mammogram for malignant neoplasm of breast: Secondary | ICD-10-CM | POA: Diagnosis not present

## 2017-10-28 DIAGNOSIS — E782 Mixed hyperlipidemia: Secondary | ICD-10-CM | POA: Diagnosis not present

## 2017-10-28 DIAGNOSIS — E039 Hypothyroidism, unspecified: Secondary | ICD-10-CM | POA: Diagnosis not present

## 2017-10-28 DIAGNOSIS — I10 Essential (primary) hypertension: Secondary | ICD-10-CM | POA: Diagnosis not present

## 2017-11-04 DIAGNOSIS — I8393 Asymptomatic varicose veins of bilateral lower extremities: Secondary | ICD-10-CM | POA: Diagnosis not present

## 2017-11-04 DIAGNOSIS — Z0000 Encounter for general adult medical examination without abnormal findings: Secondary | ICD-10-CM | POA: Diagnosis not present

## 2017-11-04 DIAGNOSIS — I10 Essential (primary) hypertension: Secondary | ICD-10-CM | POA: Diagnosis not present

## 2017-11-04 DIAGNOSIS — E782 Mixed hyperlipidemia: Secondary | ICD-10-CM | POA: Diagnosis not present

## 2017-11-14 ENCOUNTER — Encounter: Payer: Self-pay | Admitting: Gastroenterology

## 2017-12-03 ENCOUNTER — Encounter: Payer: Self-pay | Admitting: Gastroenterology

## 2017-12-03 ENCOUNTER — Ambulatory Visit (AMBULATORY_SURGERY_CENTER): Payer: Self-pay | Admitting: *Deleted

## 2017-12-03 VITALS — Ht 63.0 in | Wt 186.0 lb

## 2017-12-03 DIAGNOSIS — Z1211 Encounter for screening for malignant neoplasm of colon: Secondary | ICD-10-CM

## 2017-12-03 NOTE — Progress Notes (Signed)
No egg or soy allergy known to patient  No issues with past sedation with any surgeries  or procedures, no intubation problems  No diet pills per patient No home 02 use per patient  No blood thinners per patient  Pt denies issues with constipation  No A fib or A flutter  EMMI video sent to pt's e mail pt declined   

## 2017-12-18 ENCOUNTER — Ambulatory Visit (AMBULATORY_SURGERY_CENTER): Payer: Medicare Other | Admitting: Gastroenterology

## 2017-12-18 ENCOUNTER — Encounter: Payer: Self-pay | Admitting: Gastroenterology

## 2017-12-18 VITALS — BP 123/82 | HR 88 | Temp 98.8°F | Resp 11 | Ht 64.0 in | Wt 193.0 lb

## 2017-12-18 DIAGNOSIS — K621 Rectal polyp: Secondary | ICD-10-CM | POA: Diagnosis not present

## 2017-12-18 DIAGNOSIS — Z1211 Encounter for screening for malignant neoplasm of colon: Secondary | ICD-10-CM

## 2017-12-18 DIAGNOSIS — D128 Benign neoplasm of rectum: Secondary | ICD-10-CM

## 2017-12-18 MED ORDER — SODIUM CHLORIDE 0.9 % IV SOLN: 500.0000 mL | Freq: Once | INTRAVENOUS | Status: DC

## 2017-12-18 NOTE — Patient Instructions (Signed)
Thank you for allowing [place] to care for you today!  Await pathology results by mail, approximately 2 weeks.  Recommendation for next colonoscopy will be made at that time.  Resume previous diet and medications.  Return to normal activities tomorrow.  Handout for polyps and diverticulosis given.      YOU HAD AN ENDOSCOPIC PROCEDURE TODAY AT [facility]:   Refer to the procedure report that was given to you for any specific questions about what was found during the examination.  If the procedure report does not answer your questions, please call your gastroenterologist to clarify.  If you requested that your care partner not be given the details of your procedure findings, then the procedure report has been included in a sealed envelope for you to review at your convenience later.  YOU SHOULD EXPECT: Some feelings of bloating in the abdomen. Passage of more gas than usual.  Walking can help get rid of the air that was put into your GI tract during the procedure and reduce the bloating. If you had a lower endoscopy (such as a colonoscopy or flexible sigmoidoscopy) you may notice spotting of blood in your stool or on the toilet paper. If you underwent a bowel prep for your procedure, you may not have a normal bowel movement for a few days.  Please Note:  You might notice some irritation and congestion in your nose or some drainage.  This is from the oxygen used during your procedure.  There is no need for concern and it should clear up in a day or so.  SYMPTOMS TO REPORT IMMEDIATELY:   Following lower endoscopy (colonoscopy or flexible sigmoidoscopy):  Excessive amounts of blood in the stool  Significant tenderness or worsening of abdominal pains  Swelling of the abdomen that is new, acute  Fever of 100F or higher   For urgent or emergent issues, a gastroenterologist can be reached at any hour by calling [phone].   DIET:  We do recommend a small meal at first, but  then you may proceed to your regular diet.  Drink plenty of fluids but you should avoid alcoholic beverages for 24 hours.  ACTIVITY:  You should plan to take it easy for the rest of today and you should NOT DRIVE or use heavy machinery until tomorrow (because of the sedation medicines used during the test).    FOLLOW UP: Our staff will call the number listed on your records the next business day following your procedure to check on you and address any questions or concerns that you may have regarding the information given to you following your procedure. If we do not reach you, we will leave a message.  However, if you are feeling well and you are not experiencing any problems, there is no need to return our call.  We will assume that you have returned to your regular daily activities without incident.  If any biopsies were taken you will be contacted by phone or by letter within the next 1-3 weeks.  Please call us at [phone] if you have not heard about the biopsies in 3 weeks.    SIGNATURES/CONFIDENTIALITY: You and/or your care partner have signed paperwork which will be entered into your electronic medical record.  These signatures attest to the fact that that the information above on your After Visit Summary has been reviewed and is understood.  Full responsibility of the confidentiality of this discharge information lies with you and/or your care-partner.

## 2017-12-18 NOTE — Progress Notes (Signed)
Called to room to assist during endoscopic procedure.  Patient ID and intended procedure confirmed with present staff. Received instructions for my participation in the procedure from the performing physician.  

## 2017-12-18 NOTE — Progress Notes (Signed)
PT taken to PACU. Monitors in place. VSS. Report given to RN. 

## 2017-12-19 ENCOUNTER — Telehealth: Payer: Self-pay

## 2017-12-19 NOTE — Telephone Encounter (Signed)
  Follow up Call-  Call back number 12/18/2017  Post procedure Call Back phone  # [phone]  Permission to leave phone message Yes  Some recent data might be hidden     Patient questions:  Do you have a fever, pain , or abdominal swelling? No. Pain Score  0 *  Have you tolerated food without any problems? Yes.    Have you been able to return to your normal activities? Yes.    Do you have any questions about your discharge instructions: Diet   No. Medications  No. Follow up visit  No.  Do you have questions or concerns about your Care? No.  Actions: * If pain score is 4 or above: No action needed, pain <4.

## 2017-12-19 NOTE — Op Note (Signed)
[facility] Patient Name: Kimberly Peck Procedure Date: 12/18/2017 1:45 PM MRN: [id number] Endoscopist: Remo Lipps P. Havery Moros , MD Age: 67 Referring MD:  Date of Birth: Oct 09, 1950 Gender: Female Account #: [id number] Procedure:                Colonoscopy Indications:              Screening for colorectal malignant neoplasm Medicines:                Monitored Anesthesia Care Procedure:                Pre-Anesthesia Assessment:                           - Prior to the procedure, a History and Physical                            was performed, and patient medications and                            allergies were reviewed. The patient's tolerance of                            previous anesthesia was also reviewed. The risks                            and benefits of the procedure and the sedation                            options and risks were discussed with the patient.                            All questions were answered, and informed consent                            was obtained. Prior Anticoagulants: The patient has                            taken no previous anticoagulant or antiplatelet                            agents. ASA Grade Assessment: III - A patient with                            severe systemic disease. After reviewing the risks                            and benefits, the patient was deemed in                            satisfactory condition to undergo the procedure.                           After obtaining informed consent, the colonoscope  was passed under direct vision. Throughout the                            procedure, the patient's blood pressure, pulse, and                            oxygen saturations were monitored continuously. The                            Colonoscope was introduced through the anus and                            advanced to the the cecum, identified by                            appendiceal  orifice and ileocecal valve. The                            colonoscopy was performed without difficulty. The                            patient tolerated the procedure well. The quality                            of the bowel preparation was adequate. The                            ileocecal valve, appendiceal orifice, and rectum                            were photographed. Scope In: 1:52:50 PM Scope Out: 2:09:10 PM Scope Withdrawal Time: 0 hours 9 minutes 14 seconds  Total Procedure Duration: 0 hours 16 minutes 20 seconds  Findings:                 The perianal and digital rectal examinations were                            normal.                           A 2 to 3 mm polyp was found in the rectum. The                            polyp was sessile. The polyp was removed with a                            cold snare. Resection and retrieval were complete.                           Many medium-mouthed diverticula were found in the                            sigmoid colon.  The IC valve was quite angulated and folded back in                            itself, it was difficult to visualize behind the                            valve but nothing obvious appreciated. The exam was                            otherwise without abnormality. Complications:            No immediate complications. Estimated blood loss:                            Minimal. Estimated Blood Loss:     Estimated blood loss was minimal. Impression:               - One 2 to 3 mm polyp in the rectum, removed with a                            cold snare. Resected and retrieved.                           - Diverticulosis in the sigmoid colon.                           - The examination was otherwise normal. Recommendation:           - Patient has a contact number available for                            emergencies. The signs and symptoms of potential                            delayed complications  were discussed with the                            patient. Return to normal activities tomorrow.                            Written discharge instructions were provided to the                            patient.                           - Resume previous diet.                           - Continue present medications.                           - Await pathology results. Remo Lipps P. Jaxon Flatt, MD 12/18/2017 2:13:04 PM This report has been signed electronically.

## 2017-12-26 ENCOUNTER — Encounter: Payer: Self-pay | Admitting: Gastroenterology

## 2017-12-31 IMAGING — US US TRANSVAGINAL NON-OB
1 series · 13 of 25 positions shown · non-contrast
Comparison: July 25, 2015

CLINICAL DATA: Follow-up fibroids. Evaluate endometrial thickening.

EXAM:
TRANSABDOMINAL AND TRANSVAGINAL ULTRASOUND OF PELVIS
TECHNIQUE: Both transabdominal and transvaginal ultrasound examinations of the
pelvis were performed. Transabdominal technique was performed for
global imaging of the pelvis including uterus, ovaries, adnexal
regions, and pelvic cul-de-sac. It was necessary to proceed with
endovaginal exam following the transabdominal exam to visualize the
endometrium and ovaries.

[Series 1: us transvaginal non-ob · 0.27mm/px · 13 of 62 slices shown]
[im 1/62]
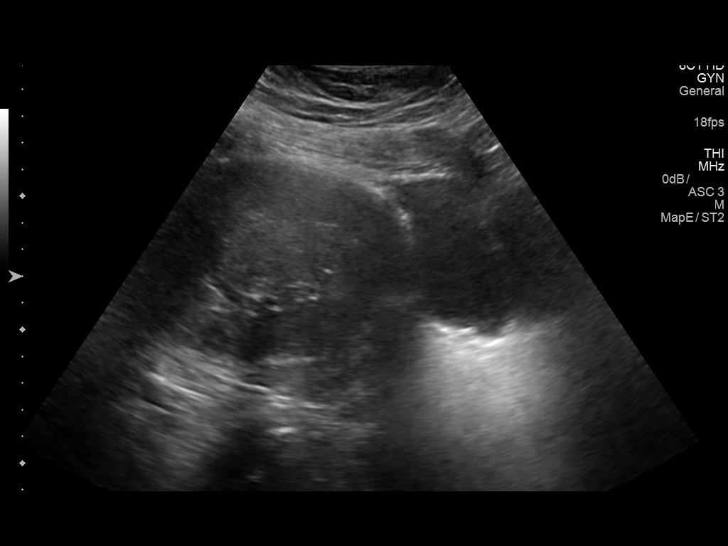
[im 6/62]
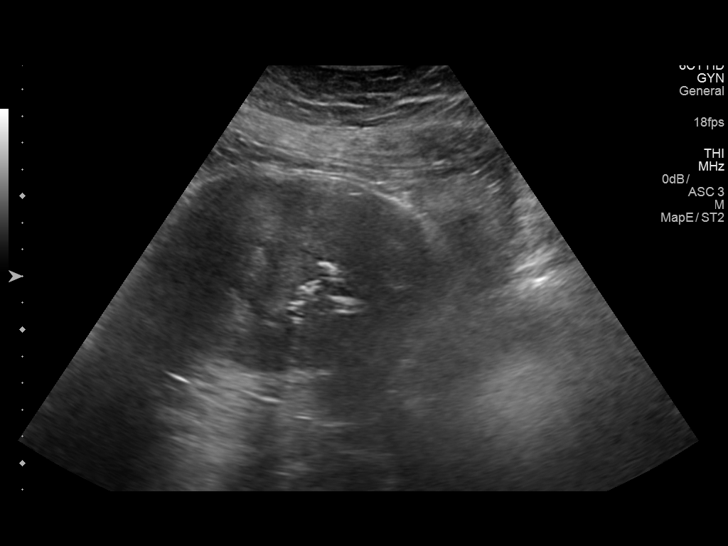
[im 11/62]
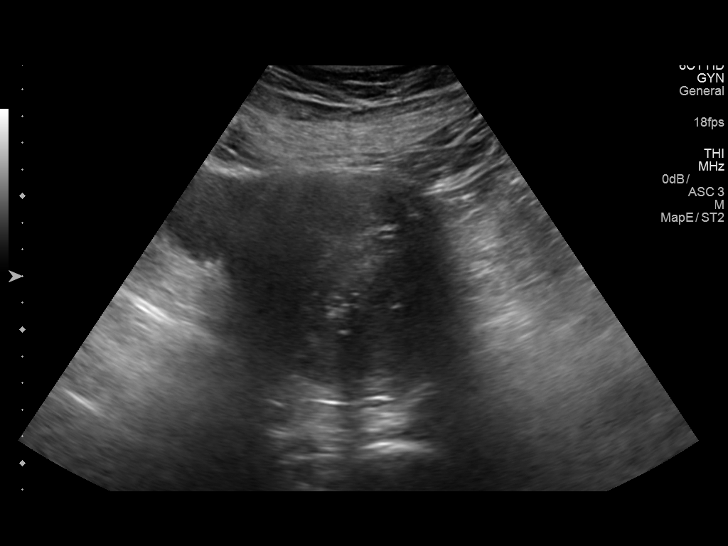
[im 16/62]
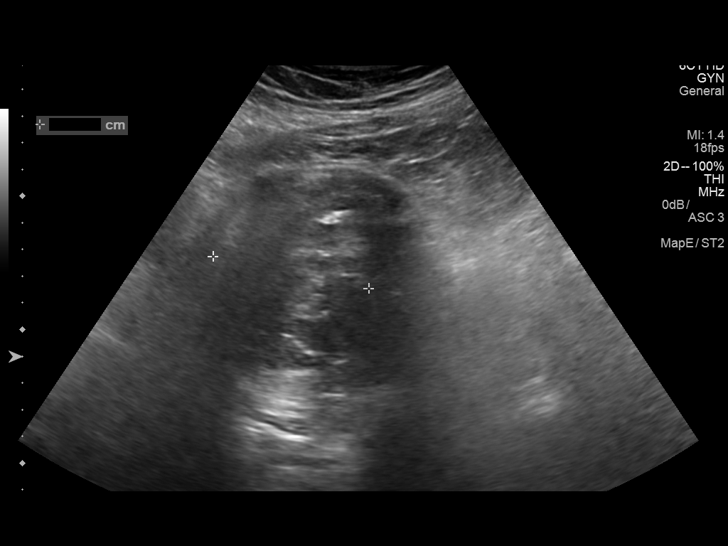
[im 21/62]
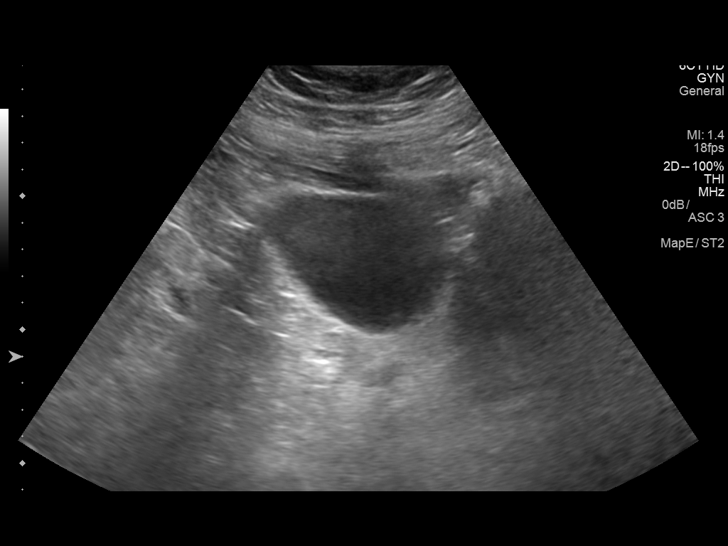
[im 26/62]
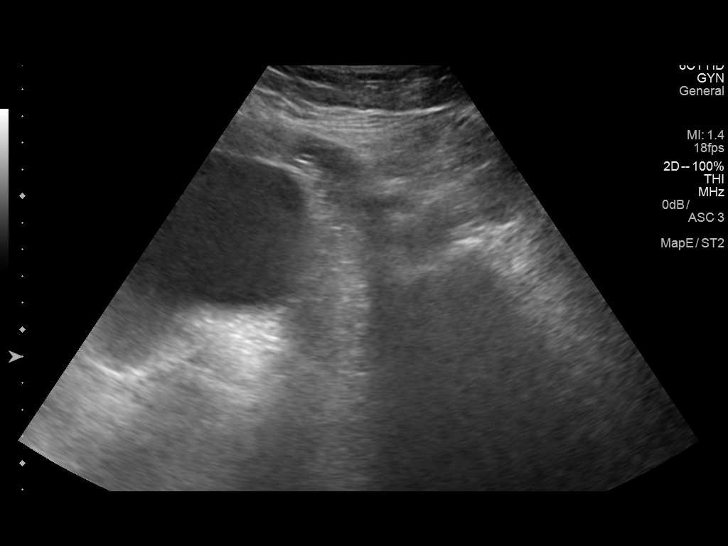
[im 31/62]
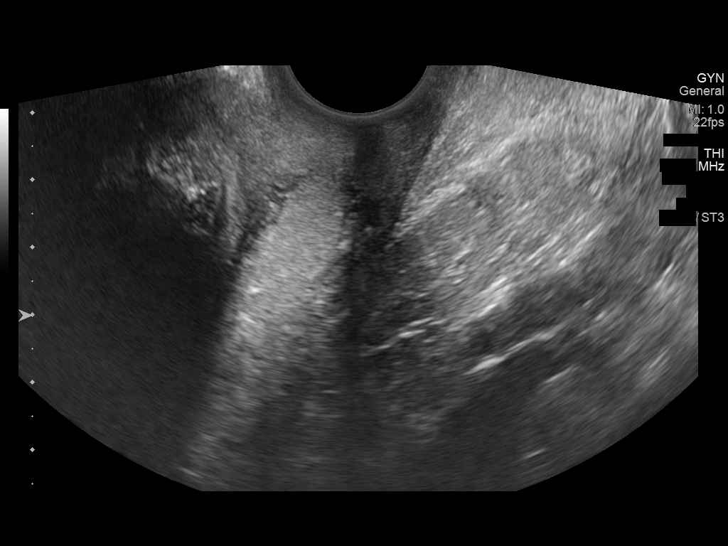
[im 36/62]
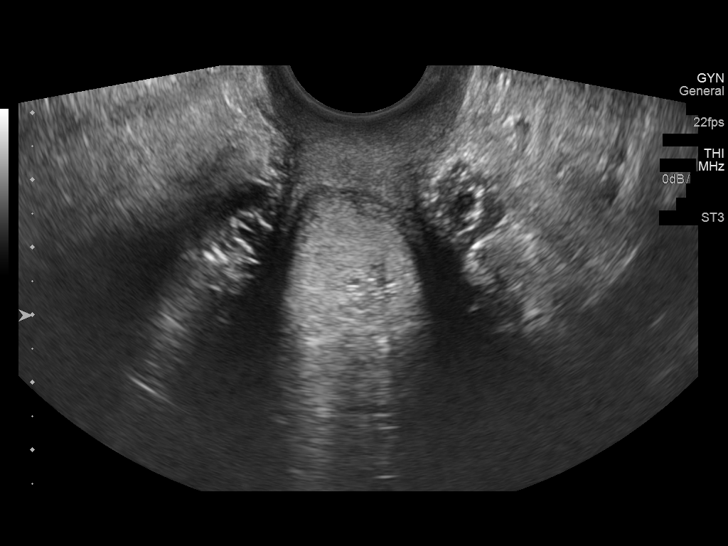
[im 41/62]
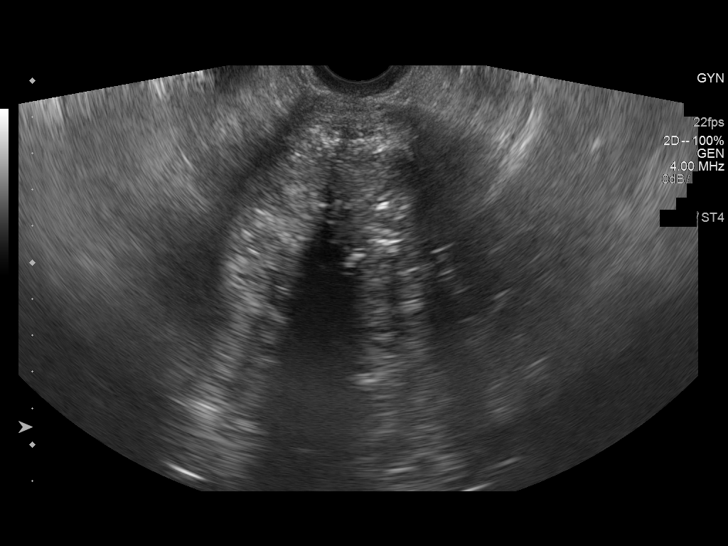
[im 46/62]
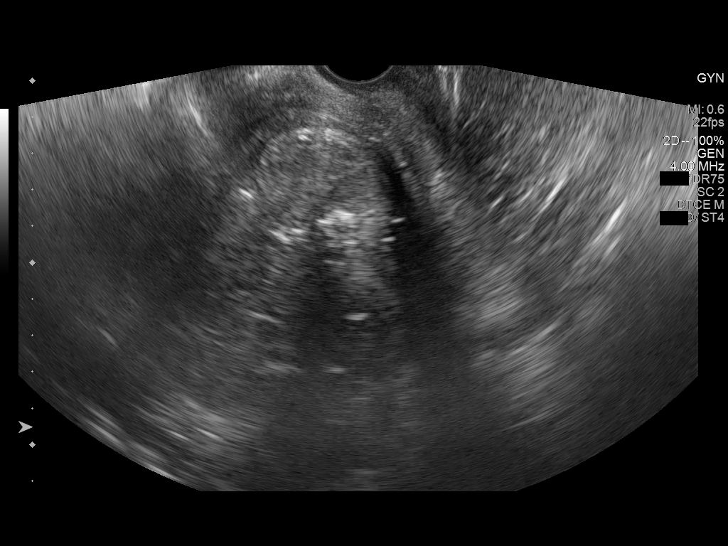
[im 51/62]
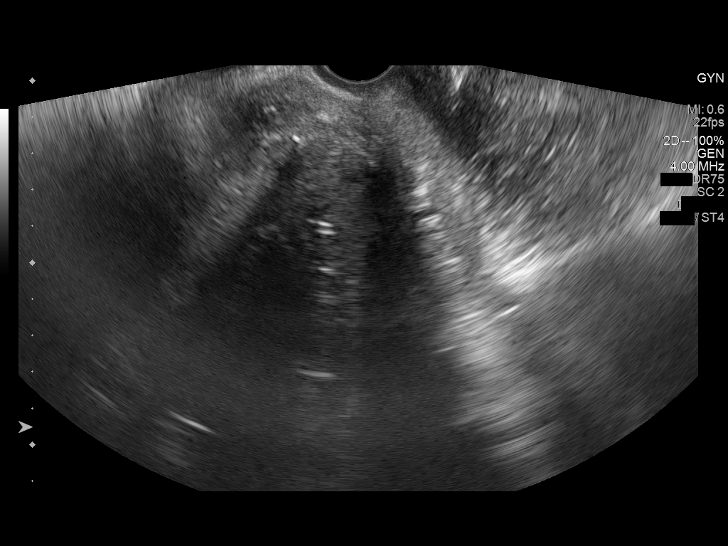
[im 56/62]
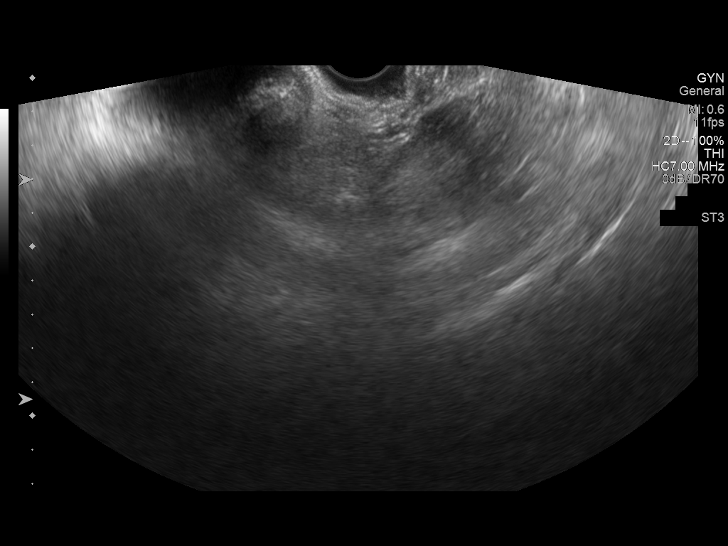
[im 62/62]
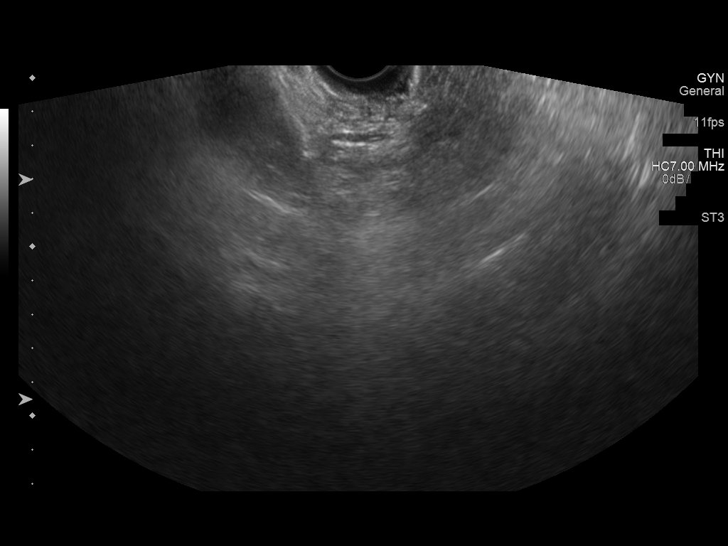

[13 of 25 positions shown; findings below may reference images not displayed]

FINDINGS: Uterus

Measurements: 11.5 x 8.9 x 9.6 cm. Evaluation is limited due to a
large fundal fibroid with shadowing. The large fibroid measures
x 6.2 x 6.3 cm today versus 8.4 x 9.1 x 9.8 cm previously. The
difference in size is probably technical as the fibroid has probably
not gotten smaller.

Endometrium

The endometrium is difficult to assess given the fibroid. It
measures up to 10.7 mm today which is abnormal in a patient of this
age. The fibroid exerts mass effect on the endometrium.

Right ovary

Not visualized.

Left ovary

Not visualized.

Other findings

No abnormal free fluid.
IMPRESSION: 1. The study is limited due to a large shadowing fibroid at the
fundus. The fibroid measures smaller today but the difference is
likely technical. The endometrium is difficult to clearly visualize
but it measured up to 10.7 mm today which is abnormal in a
postmenopausal woman. Recommend gynecologic consultation and
consider tissue sampling of the endometrium.
These results will be called to the ordering clinician or
representative by the Radiologist Assistant, and communication
documented in the PACS or zVision Dashboard.

## 2018-01-27 DIAGNOSIS — E782 Mixed hyperlipidemia: Secondary | ICD-10-CM | POA: Diagnosis not present

## 2018-01-27 DIAGNOSIS — E039 Hypothyroidism, unspecified: Secondary | ICD-10-CM | POA: Diagnosis not present

## 2018-02-03 DIAGNOSIS — I10 Essential (primary) hypertension: Secondary | ICD-10-CM | POA: Diagnosis not present

## 2018-02-03 DIAGNOSIS — E782 Mixed hyperlipidemia: Secondary | ICD-10-CM | POA: Diagnosis not present

## 2018-02-03 DIAGNOSIS — E039 Hypothyroidism, unspecified: Secondary | ICD-10-CM | POA: Diagnosis not present

## 2018-04-01 IMAGING — US US TRANSVAGINAL NON-OB
1 series · 15 of 25 positions shown · non-contrast
Comparison: No menstrual periods since 1449.

CLINICAL DATA: Follow-up of endometrial thickening and a fibroid in
unspecified location described on an outside ultrasound examination
June 16, 2015.



[Series 1: us transvaginal non-ob · 15 of 42 slices shown]
[im 1/42]
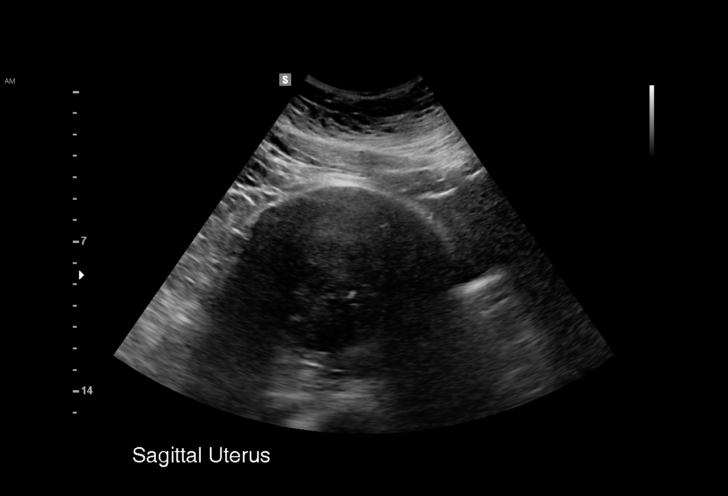
[im 4/42]
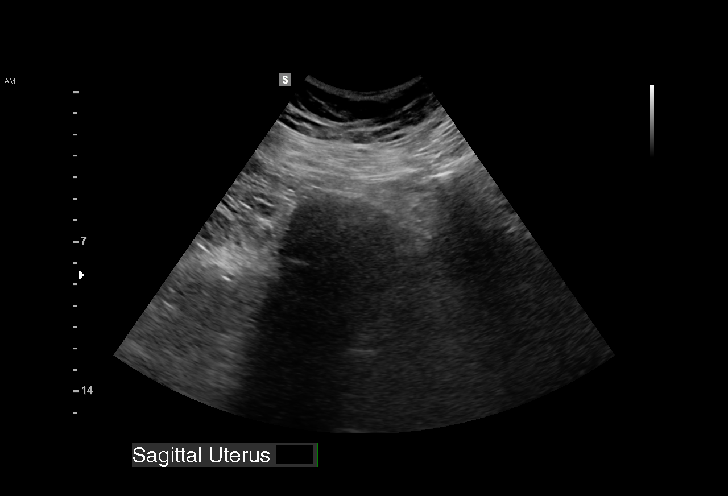
[im 7/42]
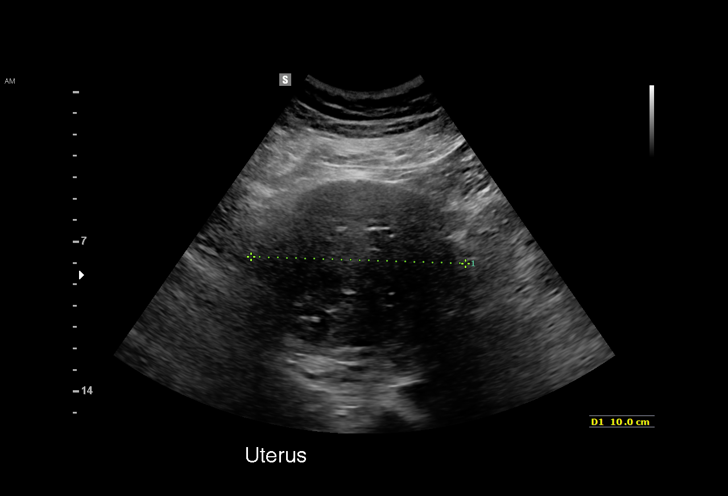
[im 9/42]
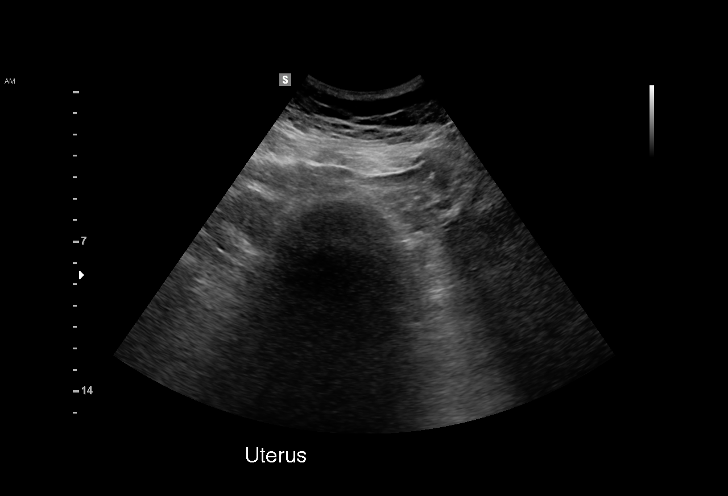
[im 12/42]
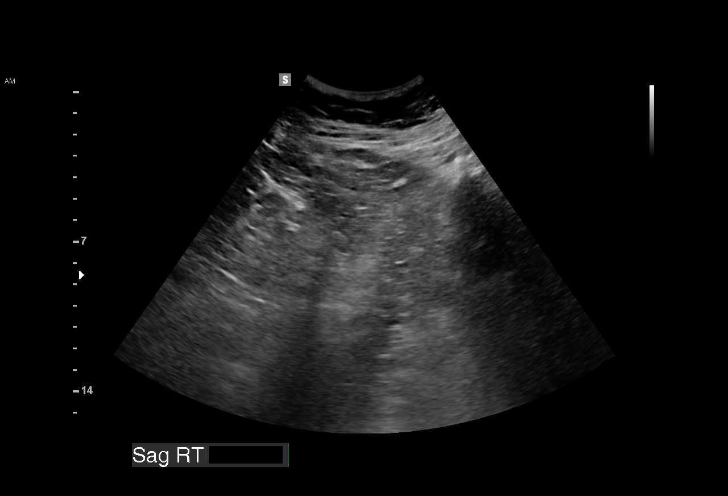
[im 16/42]
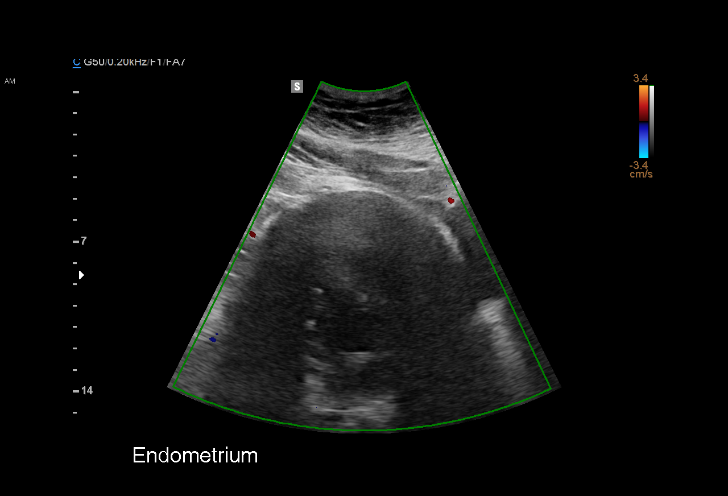
[im 18/42]
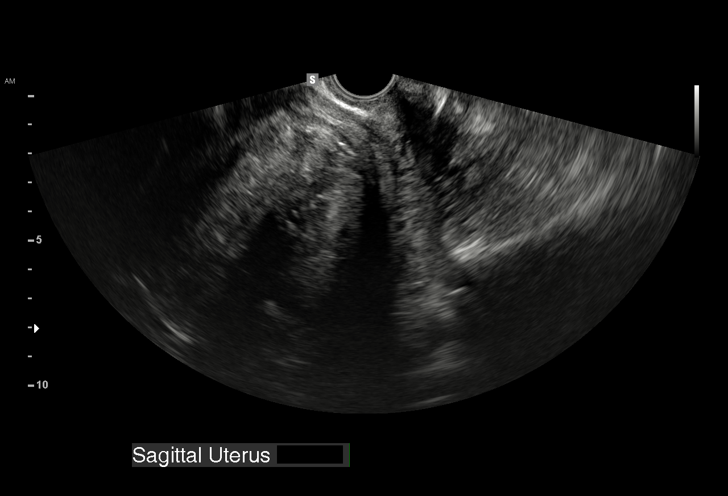
[im 21/42]
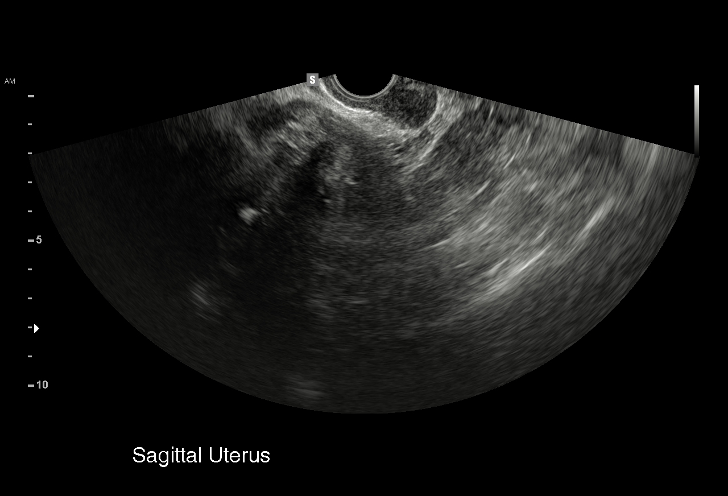
[im 24/42]
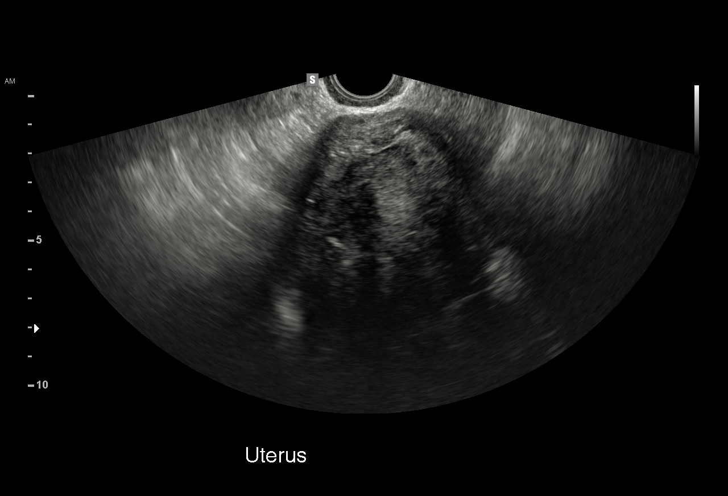
[im 26/42]
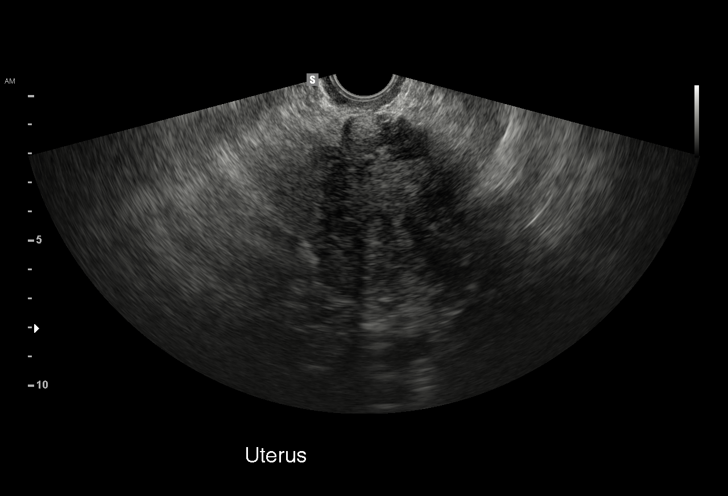
[im 30/42]
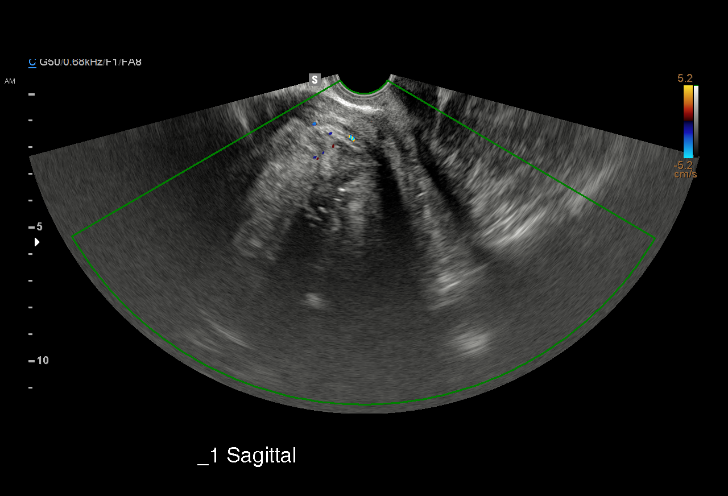
[im 33/42]
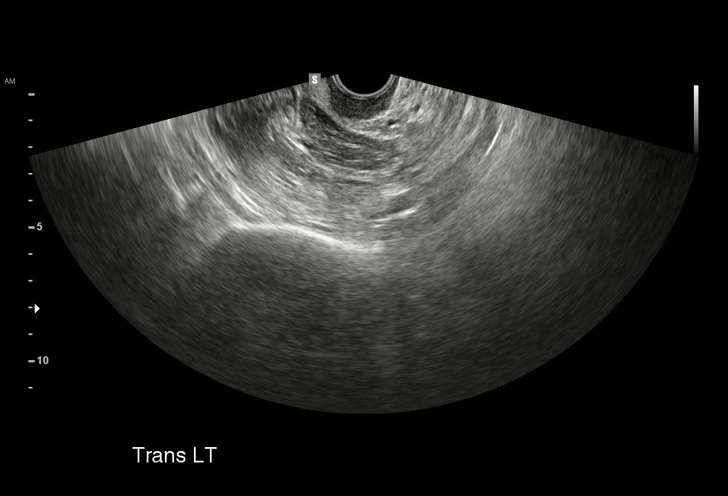
[im 35/42]
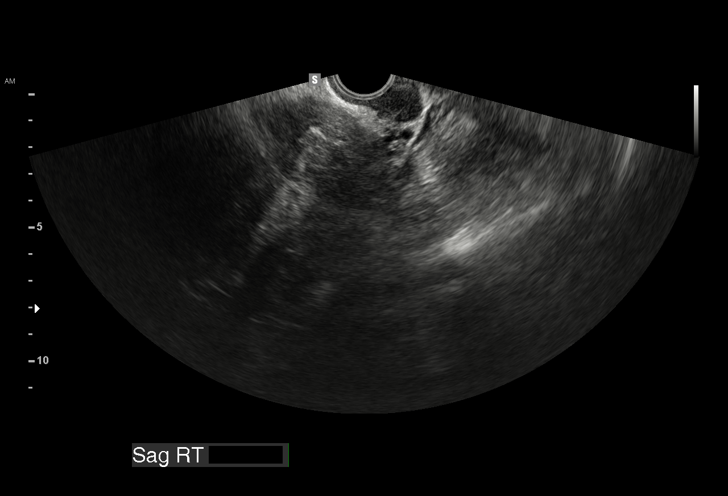
[im 38/42]
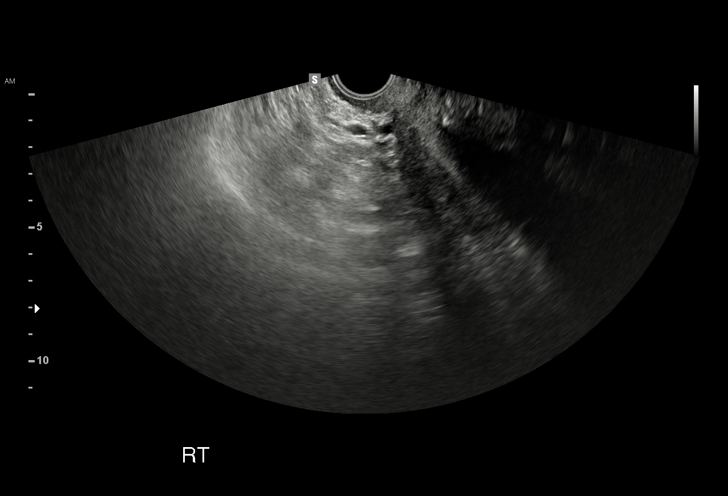
[im 42/42]
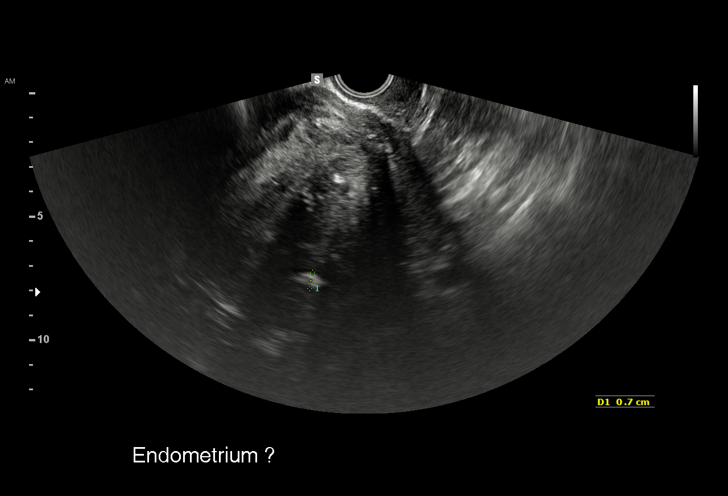

[15 of 25 positions shown; findings below may reference images not displayed]

FINDINGS: The study is limited due to shadowing from the dominant fibroid as
well as due to patient's inability to tolerate the study.

Uterus

Measurements: 12.2 x 10.1 x 9.8 cm.. There is a dominant fundal
fibroid measuring 8.4 x 9.1 x 9.8 cm.

Endometrium

Thickness: Visualization of the endometrium was markedly limited by
shadowing from the dominant fibroid. Accurate measurement of the
endometrium could not be performed.

Right ovary

The right ovary could not be visualized.

Left ovary

The left ovary could not be visualized.

Other findings

No abnormal free fluid.
IMPRESSION: 1. Dominant fundal fibroid measuring 9.8 cm in greatest dimension.
This produces significant shadowing which precludes adequate
measurement of the endometrium. The endometrial measurement given on
the previous study may in fact reflect a structure within the
fibroid according to the ultrasound technologist today.
2. The ovaries could not be visualized.

## 2019-03-09 DIAGNOSIS — Z0000 Encounter for general adult medical examination without abnormal findings: Secondary | ICD-10-CM | POA: Diagnosis not present

## 2019-03-09 DIAGNOSIS — N390 Urinary tract infection, site not specified: Secondary | ICD-10-CM | POA: Diagnosis not present

## 2019-03-09 DIAGNOSIS — Z780 Asymptomatic menopausal state: Secondary | ICD-10-CM | POA: Diagnosis not present

## 2019-03-09 DIAGNOSIS — E039 Hypothyroidism, unspecified: Secondary | ICD-10-CM | POA: Diagnosis not present

## 2019-03-09 DIAGNOSIS — D539 Nutritional anemia, unspecified: Secondary | ICD-10-CM | POA: Diagnosis not present

## 2019-03-09 DIAGNOSIS — I10 Essential (primary) hypertension: Secondary | ICD-10-CM | POA: Diagnosis not present

## 2019-03-09 DIAGNOSIS — R945 Abnormal results of liver function studies: Secondary | ICD-10-CM | POA: Diagnosis not present

## 2019-03-09 DIAGNOSIS — E782 Mixed hyperlipidemia: Secondary | ICD-10-CM | POA: Diagnosis not present

## 2019-03-12 DIAGNOSIS — E782 Mixed hyperlipidemia: Secondary | ICD-10-CM | POA: Diagnosis not present

## 2019-03-12 DIAGNOSIS — I10 Essential (primary) hypertension: Secondary | ICD-10-CM | POA: Diagnosis not present

## 2019-03-12 DIAGNOSIS — Z0000 Encounter for general adult medical examination without abnormal findings: Secondary | ICD-10-CM | POA: Diagnosis not present

## 2019-03-12 DIAGNOSIS — E039 Hypothyroidism, unspecified: Secondary | ICD-10-CM | POA: Diagnosis not present

## 2019-04-22 ENCOUNTER — Other Ambulatory Visit: Payer: Self-pay | Admitting: Internal Medicine

## 2019-04-22 DIAGNOSIS — Z1231 Encounter for screening mammogram for malignant neoplasm of breast: Secondary | ICD-10-CM

## 2019-05-28 DIAGNOSIS — R945 Abnormal results of liver function studies: Secondary | ICD-10-CM | POA: Diagnosis not present

## 2019-06-04 DIAGNOSIS — I10 Essential (primary) hypertension: Secondary | ICD-10-CM | POA: Diagnosis not present

## 2019-06-04 DIAGNOSIS — E782 Mixed hyperlipidemia: Secondary | ICD-10-CM | POA: Diagnosis not present

## 2019-06-04 DIAGNOSIS — E039 Hypothyroidism, unspecified: Secondary | ICD-10-CM | POA: Diagnosis not present

## 2019-06-04 DIAGNOSIS — R945 Abnormal results of liver function studies: Secondary | ICD-10-CM | POA: Diagnosis not present

## 2019-06-18 ENCOUNTER — Ambulatory Visit: Payer: Medicare Other | Attending: Internal Medicine

## 2019-06-18 DIAGNOSIS — Z0389 Encounter for observation for other suspected diseases and conditions ruled out: Secondary | ICD-10-CM | POA: Diagnosis not present

## 2019-06-18 DIAGNOSIS — R945 Abnormal results of liver function studies: Secondary | ICD-10-CM | POA: Diagnosis not present

## 2019-06-18 DIAGNOSIS — Z23 Encounter for immunization: Secondary | ICD-10-CM

## 2019-06-18 NOTE — Progress Notes (Signed)
   Covid-19 Vaccination Clinic  Name:  Kimberly Peck    MRN: [id number] DOB: 03/07/1950  06/18/2019  [name] was observed post Covid-19 immunization for 15 minutes without incident. She was provided with Vaccine Information Sheet and instruction to access the V-Safe system.   [name] was instructed to call 911 with any severe reactions post vaccine: Marland Kitchen Difficulty breathing  . Swelling of face and throat  . A fast heartbeat  . A bad rash all over body  . Dizziness and weakness   Immunizations Administered    Name Date Dose VIS Date Route   JANSSEN COVID-19 VACCINE 06/18/2019  1:37 PM 0.5 mL 03/14/2019 Intramuscular   Manufacturer: [employer]   Lot: [id number]   [place]: [id number]

## 2019-11-03 DIAGNOSIS — I10 Essential (primary) hypertension: Secondary | ICD-10-CM | POA: Diagnosis not present

## 2019-11-03 DIAGNOSIS — E039 Hypothyroidism, unspecified: Secondary | ICD-10-CM | POA: Diagnosis not present

## 2019-11-03 DIAGNOSIS — R945 Abnormal results of liver function studies: Secondary | ICD-10-CM | POA: Diagnosis not present

## 2019-11-03 DIAGNOSIS — E782 Mixed hyperlipidemia: Secondary | ICD-10-CM | POA: Diagnosis not present

## 2019-11-10 DIAGNOSIS — R945 Abnormal results of liver function studies: Secondary | ICD-10-CM | POA: Diagnosis not present

## 2019-11-10 DIAGNOSIS — E782 Mixed hyperlipidemia: Secondary | ICD-10-CM | POA: Diagnosis not present

## 2019-11-10 DIAGNOSIS — I10 Essential (primary) hypertension: Secondary | ICD-10-CM | POA: Diagnosis not present

## 2019-11-24 ENCOUNTER — Other Ambulatory Visit (INDEPENDENT_AMBULATORY_CARE_PROVIDER_SITE_OTHER): Payer: Medicare Other

## 2019-11-24 ENCOUNTER — Ambulatory Visit: Payer: Medicare Other | Admitting: Nurse Practitioner

## 2019-11-24 ENCOUNTER — Encounter: Payer: Self-pay | Admitting: Nurse Practitioner

## 2019-11-24 VITALS — BP 130/94 | HR 174 | Ht 63.25 in | Wt 183.5 lb

## 2019-11-24 DIAGNOSIS — R000 Tachycardia, unspecified: Secondary | ICD-10-CM | POA: Diagnosis not present

## 2019-11-24 DIAGNOSIS — R7989 Other specified abnormal findings of blood chemistry: Secondary | ICD-10-CM | POA: Diagnosis not present

## 2019-11-24 LAB — CBC WITH DIFFERENTIAL/PLATELET
Basophils Absolute: 0.1 10*3/uL (ref 0.0–0.1)
Basophils Relative: 1 % (ref 0.0–3.0)
Eosinophils Absolute: 0.3 10*3/uL (ref 0.0–0.7)
Eosinophils Relative: 4.1 % (ref 0.0–5.0)
HCT: 43.6 % (ref 36.0–46.0)
Hemoglobin: 14.7 g/dL (ref 12.0–15.0)
Lymphocytes Relative: 30.8 % (ref 12.0–46.0)
Lymphs Abs: 2 10*3/uL (ref 0.7–4.0)
MCHC: 33.9 g/dL (ref 30.0–36.0)
MCV: 105.8 fl — ABNORMAL HIGH (ref 78.0–100.0)
Monocytes Absolute: 0.7 10*3/uL (ref 0.1–1.0)
Monocytes Relative: 10.1 % (ref 3.0–12.0)
Neutro Abs: 3.6 10*3/uL (ref 1.4–7.7)
Neutrophils Relative %: 54 % (ref 43.0–77.0)
Platelets: 200 10*3/uL (ref 150.0–400.0)
RBC: 4.12 Mil/uL (ref 3.87–5.11)
RDW: 13.6 % (ref 11.5–15.5)
WBC: 6.6 10*3/uL (ref 4.0–10.5)

## 2019-11-24 LAB — HEPATIC FUNCTION PANEL
ALT: 48 U/L — ABNORMAL HIGH (ref 0–35)
AST: 84 U/L — ABNORMAL HIGH (ref 0–37)
Albumin: 3.7 g/dL (ref 3.5–5.2)
Alkaline Phosphatase: 86 U/L (ref 39–117)
Bilirubin, Direct: 0.3 mg/dL (ref 0.0–0.3)
Total Bilirubin: 1.2 mg/dL (ref 0.2–1.2)
Total Protein: 6.8 g/dL (ref 6.0–8.3)

## 2019-11-24 LAB — BASIC METABOLIC PANEL
BUN: 9 mg/dL (ref 6–23)
CO2: 29 mEq/L (ref 19–32)
Calcium: 10.4 mg/dL (ref 8.4–10.5)
Chloride: 96 mEq/L (ref 96–112)
Creatinine, Ser: 0.83 mg/dL (ref 0.40–1.20)
GFR: 70.95 mL/min (ref >60.00)
Glucose, Bld: 98 mg/dL (ref 70–99)
Potassium: 3.9 mEq/L (ref 3.5–5.1)
Sodium: 134 mEq/L — ABNORMAL LOW (ref 135–145)

## 2019-11-24 LAB — GAMMA GT: GGT: 188 U/L — ABNORMAL HIGH (ref 7–51)

## 2019-11-24 LAB — MAGNESIUM: Magnesium: 1.5 mg/dL (ref 1.5–2.5)

## 2019-11-24 NOTE — Progress Notes (Addendum)
11/24/2019 Kimberly Peck [id number] 05-Dec-1950   Chief Complaint: Elevated liver enzymes  History of Present Illness:  Kimberly Peck is a 54 hypertension, hyperlipidemia, hypothyroidism and neuropathy. S/P cholecystectomy due to gallstones in the 1970's.  She presents to our office today as referred by her primary care physician Dr. Ashby Peck for further evaluation regarding elevated liver enzymes.  She underwent an abdominal ultrasound 06/18/2019, evaluation of the liver was limited by attenuation artifact, visualized parenchyma within normal limits, liver span approximately 15 cm.  No intrahepatic biliary ductal dilatation noted.  Past cholecystectomy.  Laboratory studies 11/03/2019: Total bili 0.6.  Alk phos 96.  AST 235.  ALT 75.  Glucose 94.  BUN 6.  Creatinine 0.67.  Sodium 140.  Potassium 4.7.  Chloride 101.  Calcium 8.9.  Albumin 2.5. Laboratory studies 05/28/2019: Total bili 0.5.  Alk phos 85.  AST 69.  ALT 41.  She denies having any nausea or vomiting.  No upper or lower abdominal pain.  No jaundice.  No pruritus.  Her appetite is good.  No weight loss.  No fever, sweats or chills.  She is urinating normal yellow color urine.  She is passing normal formed brown bowel movement once or twice daily.  No rectal bleeding or melena.  No alcohol use.  No new medications or antibiotics within the past 6 months. No herbal supplements. No family history of liver disease or liver cancer.  Colonoscopy 12/18/2017 by Dr. Havery Peck: - One 2 to 3 mm plastic polyp in the rectum, removed with a cold snare. Resected and retrieved. - Diverticulosis in the sigmoid colon. -The examination was otherwise normal. -Repeat colonoscopy in 10 years if appropriate at that time  Her vital signs were taken at the time of her office visit today.  Her heart rate was significantly elevated at 174 to 180 bpm.  Her blood pressure 130/94.  She reported feeling anxious and she stated she could feel  her heart pounding.  She denied having any chest pain, dizziness or shortness of breath.  She was hemodynamically stable.  She takes Metoprolol 25 mg daily without interruption.  She denies caffeine overuse.  She denies any prior history of tachycardia.  Due to her significant tachycardia,  I advised the patient to proceed to [facility] emergency [facility] via EMS, however, she declined. She stated " I have to go home to check on my husband". Her husband is elderly, alert and ambulates at home with a walker.  I informed the patient that she is at risk for a heart attack, stroke and death due to her elevated heart rate of unclear etiology.  I also informed the patient not to drive due to her significant tachycardia as she is at at risk for vascular compromise which could result in MI/stroke and inability to drive safely.  Supervising physician Dr. Havery Peck was consulted and he agreed with the above recommendations.  She left our office against my medical advice.  I advised the patient to contact her primary care physician for further evaluation.     Past Medical History:  Diagnosis Date  . 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 Blood transfusion without reported diagnosis   . Cataract    removed both eyes  . Fibroids    large uterine fibroid  . Fistula    benign-rectal  . Hyperlipidemia   . Hyperplastic polyp of intestine   . Hypertension   . Peripheral neuropathy   . Psoriasis 2018  .  Rectal pain   . Thickened endometrium    Unsuccessful endometrial sampling through office [place].    . Thyroid disease    hypothyroidism  . Vitamin D deficiency    Past Surgical History:  Procedure Laterality Date  . CATARACT EXTRACTION Bilateral 3/08, 6/09  . CATARACT EXTRACTION, BILATERAL    . CHOLECYSTECTOMY  10/79  . COLONOSCOPY    . HYSTEROSCOPY  6/05   removal of imbedded IUD  . IUD REMOVAL     surgically removed as it was embedded in the uterus  . POLYPECTOMY     HPP 20 cm  09-01-2007 Dr Kimberly Peck    Current Outpatient Medications on File Prior to Visit  Medication Sig Dispense Refill  . Calcium Carb-Cholecalciferol (CALCIUM 600+D3) 600-200 MG-UNIT TABS Take 1 tablet by mouth daily.    . Cholecalciferol (VITAMIN D PO) Take 5,000 Int'l Units by mouth daily.     Marland Kitchen levothyroxine (SYNTHROID) 125 MCG tablet Take 125 mcg by mouth daily.    . metoprolol succinate (TOPROL-XL) 25 MG 24 hr tablet Take 1 tablet by mouth daily.  4  . Multiple Vitamins-Minerals (MULTIVITAMIN PO) Take by mouth daily.     No current facility-administered medications on file prior to visit.   Allergies  Allergen Reactions  . Kimberly Peck [Kimberly Peck]   . Kimberly Peck Itching     Current Medications, Allergies, Past Medical History, Past Surgical History, Family History and Social History were reviewed in [employer] record.   Review of Systems:   Constitutional: Negative for fever, sweats, chills or weight loss.  Respiratory: Negative for shortness of breath.   Cardiovascular: Varicose veins in legs. Negative for chest pain, palpitations and leg swelling.  Gastrointestinal: See HPI.  Musculoskeletal: Negative for back pain or muscle aches.  Neurological: Negative for dizziness, headaches or paresthesias.    Physical Exam: BP (!) 130/94 (BP Location: Left Arm, Patient Position: Sitting, Cuff Size: Normal)   Pulse (!) 174   Ht 5' 3.25" (1.607 m) Comment: height measured without shoes  Wt 183 lb 8 oz (83.2 kg)   LMP 01/15/2002   BMI 32.25 kg/m  General: 69 year old female in no acute distress. Head: Normocephalic and atraumatic. Eyes: No scleral icterus. Conjunctiva pink . Ears: Normal auditory acuity. Mouth: Dentition intact. No ulcers or lesions.  Lungs: Clear throughout to auscultation. Heart: Tachycardic, regular rhythm.  No murmur. Abdomen: Soft, nontender and nondistended. No masses or hepatomegaly. Normal bowel sounds x 4 quadrants.   Large right upper quadrant scar intact. Rectal: Deferred. Musculoskeletal: Symmetrical with no gross deformities. Extremities: No edema. Neurological: Alert oriented x 4. No focal deficits.  Psychological: Alert and cooperative. Normal mood and affect  Assessment and Recommendations:  71.  69 year old female with elevated AST/ALT levels. -CBC, hepatic panel, BMP, GGT, mitochondrial antibody, smooth muscle antibody, ANA, hepatitis A antibody total, hepatitis C antibody, hepatitis B surface antigen, hepatitis B surface antibody, alpha-1 antitrypsin, IgG level. -Follow-up in office in 2 to 3 weeks -CTAP to further evaluate the liver to be ordered at the time of her follow-up appointment  2.  New onset tachycardia with heart rate 170 - 180 b/min.  Hemodynamically stable. -I advised the patient to go to [facility] ED by EMS for further evaluation as she is at risk for MI, stroke.  See further details in HPI above.  Patient refused to go to the ED.  I advised the patient contact her PCP ASAP for further evaluation. -Also advised the  patient not to drive  Addendum: CK level 21 on 05/29/2019.

## 2019-11-24 NOTE — Progress Notes (Signed)
Agree with assessment as outlined. Sounds like patient has an SVT and recommended to go to the ED for further evaluation immediately. She left the office AMA despite counseling about risks of cardiovascular compromise. She should not be driving and was counseled to not drive until this issue is addressed.    Otherwise, given AST > ALT elevation, while I agree with serologic workup, would add creatine kinase to make sure that is normal and AST elevation is not muscle in etiology.  Jaclyn Shaggy can you also send a message to the patient's PCP about the SVT / tachycardia issue and that she declined our recommendations to go to the hospital.

## 2019-11-24 NOTE — Patient Instructions (Addendum)
Your heart rate was 174 to 180 beats/minute during your office appointment today. I recommend for you to go to [facility] Emergency room for immediate evaluation. You have declined to go to the hospital ER at this time. You are at risk for a heart attack, stroke or death with an elevated heart rate 170-s to 180.   I also recommend for you not to drive a vehicle until you have been further evaluated   Your provider has requested that you go to the basement level for lab work before leaving today. Press "B" on the elevator. The lab is located at the first door on the left as you exit the elevator.  Due to recent changes in healthcare laws, you may see the results of your imaging and laboratory studies on MyChart before your provider has had a chance to review them.  We understand that in some cases there may be results that are confusing or concerning to you. Not all laboratory results come back in the same time frame and the provider may be waiting for multiple results in order to interpret others.  Please give [place] 48 hours in order for your provider to thoroughly review all the results before contacting the office for clarification of your results.   Please call and follow with your PCP in the next several days. Please come back and see [place] in the next several weeks. Bridgid told to call back and set up this appointment.  I appreciate the opportunity to care for you. Carl Best, CRNP

## 2019-11-25 ENCOUNTER — Telehealth: Payer: Self-pay | Admitting: Nurse Practitioner

## 2019-11-25 LAB — IGA: Immunoglobulin A: 667 mg/dL — ABNORMAL HIGH (ref 70–320)

## 2019-11-25 LAB — IGG: IgG (Immunoglobin G), Serum: 1144 mg/dL (ref 600–1540)

## 2019-11-25 NOTE — Telephone Encounter (Signed)
I called Dr. Darrell Jewel office and I spoke to his nurse, I discussed the patient's office visit yesterday with HR 174 - 180 b/m  presumed SVT (rhythm was regular on exam). Patient refused to go to the ED. Refer to office consult note 10/9 for further details. She will provide Dr. Ashby Dawes with this info and she will contact the patient today for appropriate follow up.

## 2019-11-28 LAB — ANTI-SMOOTH MUSCLE ANTIBODY, IGG: Actin (Smooth Muscle) Antibody (IGG): <20 U (ref <20)

## 2019-11-28 LAB — HEPATITIS B SURFACE ANTIGEN: Hepatitis B Surface Ag: NONREACTIVE

## 2019-11-28 LAB — MITOCHONDRIAL ANTIBODIES: Mitochondrial M2 Ab, IgG: <=20 U

## 2019-11-28 LAB — HEPATITIS B SURFACE ANTIBODY,QUALITATIVE: Hep B S Ab: NONREACTIVE

## 2019-11-28 LAB — HEPATITIS C ANTIBODY
Hepatitis C Ab: NONREACTIVE
SIGNAL TO CUT-OFF: 0.01 (ref <1.00)

## 2019-11-28 LAB — ANA: Anti Nuclear Antibody (ANA): NEGATIVE

## 2019-11-28 LAB — HEPATITIS A ANTIBODY, TOTAL: Hepatitis A AB,Total: REACTIVE — AB

## 2019-11-28 LAB — ALPHA-1-ANTITRYPSIN: A-1 Antitrypsin, Ser: 144 mg/dL (ref 83–199)

## 2019-12-01 ENCOUNTER — Telehealth: Payer: Self-pay

## 2019-12-01 ENCOUNTER — Other Ambulatory Visit: Payer: Self-pay

## 2019-12-01 DIAGNOSIS — R7989 Other specified abnormal findings of blood chemistry: Secondary | ICD-10-CM

## 2019-12-01 NOTE — Telephone Encounter (Signed)
Left message to please call back. °

## 2019-12-01 NOTE — Telephone Encounter (Signed)
-----   Message from Noralyn Pick, NP sent at 11/29/2019  7:15 AM EST ----- Beth, pls contact the patient and let her know her liver enzymes AST/ALT levels have improved. Pls schedule her for a follow up appointment in 2 weeks and provide her with a lab order to repeat a hepatic panel  1 or 2 days before her appt.   Patient has HR 174-180 during her office appt 11/9 and she refused to go to ED. I contacted her PCP and hopefully she agreed to appropriate follow up for this.  I will further discuss her elevated GGT and MCV level (will re-review alcohol history intake) at the time of her follow up appt. IGA is elevated and she will need follow up for this as well.

## 2019-12-21 ENCOUNTER — Other Ambulatory Visit (INDEPENDENT_AMBULATORY_CARE_PROVIDER_SITE_OTHER): Payer: Medicare Other

## 2019-12-21 DIAGNOSIS — R7989 Other specified abnormal findings of blood chemistry: Secondary | ICD-10-CM

## 2019-12-21 LAB — HEPATIC FUNCTION PANEL
ALT: 34 U/L (ref 0–35)
AST: 54 U/L — ABNORMAL HIGH (ref 0–37)
Albumin: 3.6 g/dL (ref 3.5–5.2)
Alkaline Phosphatase: 67 U/L (ref 39–117)
Bilirubin, Direct: 0.3 mg/dL (ref 0.0–0.3)
Total Bilirubin: 1.2 mg/dL (ref 0.2–1.2)
Total Protein: 6.9 g/dL (ref 6.0–8.3)

## 2019-12-22 ENCOUNTER — Ambulatory Visit (INDEPENDENT_AMBULATORY_CARE_PROVIDER_SITE_OTHER): Payer: Medicare Other | Admitting: Nurse Practitioner

## 2019-12-22 ENCOUNTER — Encounter: Payer: Self-pay | Admitting: Nurse Practitioner

## 2019-12-22 VITALS — BP 110/70 | HR 84 | Ht 63.25 in | Wt 182.8 lb

## 2019-12-22 DIAGNOSIS — R718 Other abnormality of red blood cells: Secondary | ICD-10-CM

## 2019-12-22 DIAGNOSIS — R7989 Other specified abnormal findings of blood chemistry: Secondary | ICD-10-CM

## 2019-12-22 NOTE — Progress Notes (Signed)
12/22/2019 KELSA JAWOROWSKI [id number] 12-04-1950   Chief Complaint:  Follow up elevated LFTs  History of Present Illness: [name] is a 36 hypertension, hyperlipidemia, hypothyroidism and neuropathy. S/P cholecystectomy due to gallstones in the 1970's.  She presents to our office today for further follow-up regarding elevated LFTs.  I initially saw the patient in the office on 11/24/2019.  At that time, laboratory studies were reviewed as follows:  Laboratory studies 05/28/2019: Total bili 0.5.  Alk phos 85.  AST 69.  ALT 41.  CK 21.  Laboratory studies 11/03/2019: Total bili 0.6.  Alk phos 96.  AST 235.  ALT 75.  Glucose 94.  BUN 6.  Creatinine 0.67. Sodium 140.  Potassium 4.7.  Chloride 101.  Calcium 8.9.  Albumin 2.5.  Laboratory studies 11/24/2019: Kos 86.  Albumin 3.7.  AST 84.  ALT 48.  Direct bili 0.3.  Total bili 1.2.  GGT 188.  IgG 1144.  Alpha-1 antitrypsin level 144.  IgA 667.  ANA negative.  AMA less than 20.  Hepatitis A total antibody reactive.  Hepatitis B surface antigen nonreactive.  Hepatitis B surface antibody nonreactive.  Hepatitis C antibody nonreactive.  WBC 6.6.  Hemoglobin 14.7.  Hematocrit 43.6.  MCV 105.8.  Platelet 200.  Laboratory studies 12/21/2019: Alk phos 67.  Albumin 3.6.  AST 54.  ALT 34.  Direct bili 0.3.  Total bili 1.2.  Abdominal ultrasound 06/18/2019: evaluation of the liver was limited by attenuation artifact, visualized parenchyma within normal limits, liver span approximately 15 cm.  No intrahepatic biliary ductal dilatation noted. Past cholecystectomy.  At the time of her office visit on 11/24/2019, she was under extreme stress.  Her heart rate was 174 to 180 bpm.  Blood pressure 130/94.  She was advised to go to the emergency room for further evaluation, however, she declined.  I contacted her PCP to facilitate appropriate follow-up.  Her heart rate in the office today is 84.  Her blood pressure is 110/70.  No chest pain,  palpitations or shortness of breath.  No GERD symptoms.  She is passing normal bowel movement most days.  No rectal bleeding or black stools.  She drinks 1 glass of wine or 1 cocktail or 1 beer daily.  No history of excessive alcohol use.  Family history of liver disease.   Current Outpatient Medications on File Prior to Visit  Medication Sig Dispense Refill  . Calcium Carb-Cholecalciferol (CALCIUM 600+D3) 600-200 MG-UNIT TABS Take 2 tablets by mouth daily.     . Cholecalciferol (VITAMIN D PO) Take 5,000 Int'l Units by mouth daily.     Marland Kitchen levothyroxine (SYNTHROID) 125 MCG tablet Take 125 mcg by mouth daily.    . metoprolol succinate (TOPROL-XL) 25 MG 24 hr tablet Take 1 tablet by mouth daily.  4  . Multiple Vitamins-Minerals (MULTIVITAMIN PO) Take by mouth daily.     No current facility-administered medications on file prior to visit.    Allergies  Allergen Reactions  . Hctz [Hydrochlorothiazide]   . Sulfa Drugs Cross Reactors Itching     Current Medications, Allergies, Past Medical History, Past Surgical History, Family History and Social History were reviewed in [employer] record.   Review of Systems:   Constitutional: Negative for fever, sweats, chills or weight loss.  Respiratory: Negative for shortness of breath.   Cardiovascular: Negative for chest pain, palpitations and leg swelling.  Gastrointestinal: See HPI.  Musculoskeletal: Negative for back pain or muscle aches.  Neurological:  Negative for dizziness, headaches or paresthesias.    Physical Exam: LMP 01/15/2002    Wt Readings from Last 3 Encounters:  12/22/19 182 lb 12.8 oz (82.9 kg)  11/24/19 183 lb 8 oz (83.2 kg)  12/18/17 193 lb (87.5 kg)   General: Well developed 69 year old female in no acute distress. Head: Normocephalic and atraumatic. Eyes: No scleral icterus. Conjunctiva pink . Ears: Normal auditory acuity. Lungs: Clear throughout to auscultation. Heart: Regular rate and rhythm,  no murmur. Abdomen: Soft, nontender and nondistended. No masses or hepatomegaly. Normal bowel sounds x 4 quadrants.  Rectal: Deferred.  Musculoskeletal: Symmetrical with no gross deformities. Extremities: Mild lower extremity edema. Neurological: Alert oriented x 4. No focal deficits.  Psychological: Alert and cooperative. Normal mood and affect  Assessment and Recommendations:  1.  Elevated LFT levels drifting downward.  GGT and MCV elevated.  Patient denies excessive alcohol intake. Abd sono 06/2019 showed a normal liver but images were somewhat limited due to attenuation artifact. -Repeat hepatic panel, GGT, vitamin V67 and folic acid in 2 months. -Follow-up in the office in 82-month-Consider CTAP if LFTs remain elevated at time of follow up appt   2.  History of hyperplastic rectal polyp -Next colonoscopy due 12/2027  3.  Elevated IgA level -Patient to follow-up with her PCP regarding elevated IgA level, to consider hematology versus immunology evaluation

## 2019-12-22 NOTE — Patient Instructions (Addendum)
  Follow up with your primary physician regarding elevated IgA level, swelling to your legs and to receive hepatitis B vaccination.   Repeat labs to include a hepatic panel and GGT level in 2 months.  Follow up with Dr. Havery Moros in 2 months.  If you are age 71 or older, your body mass index should be between 23-30. Your Body mass index is 32.13 kg/m. If this is out of the aforementioned range listed, please consider follow up with your Primary Care Provider.  If you are age 36 or younger, your body mass index should be between 19-25. Your Body mass index is 32.13 kg/m. If this is out of the aformentioned range listed, please consider follow up with your Primary Care Provider.

## 2019-12-22 NOTE — Addendum Note (Signed)
Addended by: Horris Latino on: 12/22/2019 04:47 PM   Modules accepted: Orders

## 2019-12-23 NOTE — Progress Notes (Signed)
Agree with assessment with the following thoughts: - with AST > ALT elevation and elevation in MCV, this is suspicious for alcohol driving this process, she may be minimizing her alcohol use. Agree with B12 to rule out deficiency. She should stop all alcohol for a few weeks and repeat LFTs to see if they normalize in that setting. - I would also check creatine kinase to make sure okay given persistent AST elevation, rule out muscle breakdown

## 2019-12-31 NOTE — Progress Notes (Signed)
Kimberly Peck, I attempted to call the patient  and I did not wish to leave a detailed msg on her home voicemail. Not sure if she has a cell phone which would be more private. Can you reach her and let her know Dr. Havery Moros reviewed her last office visit and he would kindly ask the patient to stop drinking all alcohol for 4 weeks then recheck her hepatic panel and CK level. If you don't reach her send this msg back to me. Thanks.

## 2020-01-04 ENCOUNTER — Telehealth: Payer: Self-pay

## 2020-01-04 ENCOUNTER — Other Ambulatory Visit: Payer: Self-pay

## 2020-01-04 DIAGNOSIS — R7989 Other specified abnormal findings of blood chemistry: Secondary | ICD-10-CM

## 2020-01-04 NOTE — Telephone Encounter (Signed)
Left message on her cell and her home phone asking she call me back about having future labs.

## 2020-01-04 NOTE — Telephone Encounter (Addendum)
-----   Message from Noralyn Pick, NP sent at 12/31/2019  4:40 PM EST -----  Eustaquio Maize, I attempted to call the patient  and I did not wish to leave a detailed msg on her home voicemail. Not sure if she has a cell phone which would be more private. Can you reach her and let her know Dr. Havery Moros reviewed her last office visit and he would kindly ask the patient to stop drinking all alcohol for 4 weeks then recheck her hepatic panel and CK level. If you don't reach her send this msg back to me. Thanks.

## 2020-01-12 ENCOUNTER — Telehealth: Payer: Self-pay

## 2020-01-12 NOTE — Telephone Encounter (Signed)
Left message on both phone# to please call back

## 2020-01-13 ENCOUNTER — Telehealth: Payer: Self-pay | Admitting: Nurse Practitioner

## 2020-01-13 NOTE — Telephone Encounter (Signed)
The message has been given to the pt and she will come in about 4 weeks for the labs. The labs have been ordered    "Beth, I attempted to call the patient and I did not wish to leave a detailed msg on her home voicemail. Not sure if she has a cell phone which would be more private. Can you reach her and let her know Dr. Adela Lank reviewed her last office visit and he would kindly ask the patient to stop drinking all alcohol for 4 weeks then recheck her hepatic panel and CK level. If you don't reach her send this msg back to me. Thanks."

## 2020-02-22 ENCOUNTER — Telehealth: Payer: Self-pay

## 2020-02-22 NOTE — Telephone Encounter (Signed)
Called patient at home number and LM to go to the lab for [facility] and reminder that she has an appointment with Dr. Havery Moros next week on 2-15.

## 2020-02-25 ENCOUNTER — Other Ambulatory Visit (INDEPENDENT_AMBULATORY_CARE_PROVIDER_SITE_OTHER): Payer: Medicare Other

## 2020-02-25 DIAGNOSIS — R7989 Other specified abnormal findings of blood chemistry: Secondary | ICD-10-CM

## 2020-02-25 DIAGNOSIS — R718 Other abnormality of red blood cells: Secondary | ICD-10-CM

## 2020-02-25 LAB — HEPATIC FUNCTION PANEL
ALT: 31 U/L (ref 0–35)
AST: 52 U/L — ABNORMAL HIGH (ref 0–37)
Albumin: 3.8 g/dL (ref 3.5–5.2)
Alkaline Phosphatase: 70 U/L (ref 39–117)
Bilirubin, Direct: 0.3 mg/dL (ref 0.0–0.3)
Total Bilirubin: 1 mg/dL (ref 0.2–1.2)
Total Protein: 6.9 g/dL (ref 6.0–8.3)

## 2020-02-25 LAB — CK: Total CK: 27 U/L (ref 7–177)

## 2020-02-25 LAB — GAMMA GT: GGT: 128 U/L — ABNORMAL HIGH (ref 7–51)

## 2020-02-25 LAB — FOLATE: Folate: >23.6 ng/mL (ref >5.9)

## 2020-02-25 LAB — BASIC METABOLIC PANEL
BUN: 7 mg/dL (ref 6–23)
CO2: 27 mEq/L (ref 19–32)
Calcium: 9.3 mg/dL (ref 8.4–10.5)
Chloride: 100 mEq/L (ref 96–112)
Creatinine, Ser: 0.88 mg/dL (ref 0.40–1.20)
GFR: 66.02 mL/min (ref >60.00)
Glucose, Bld: 93 mg/dL (ref 70–99)
Potassium: 3.8 mEq/L (ref 3.5–5.1)
Sodium: 137 mEq/L (ref 135–145)

## 2020-02-25 LAB — VITAMIN B12: Vitamin B-12: 173 pg/mL — ABNORMAL LOW (ref 211–911)

## 2020-03-01 ENCOUNTER — Other Ambulatory Visit: Payer: Self-pay

## 2020-03-01 ENCOUNTER — Ambulatory Visit: Payer: Medicare Other | Admitting: Gastroenterology

## 2020-03-01 ENCOUNTER — Encounter: Payer: Self-pay | Admitting: Gastroenterology

## 2020-03-01 ENCOUNTER — Other Ambulatory Visit: Payer: Medicare Other

## 2020-03-01 VITALS — BP 134/80 | HR 96 | Ht 63.25 in | Wt 183.0 lb

## 2020-03-01 DIAGNOSIS — Z789 Other specified health status: Secondary | ICD-10-CM

## 2020-03-01 DIAGNOSIS — E538 Deficiency of other specified B group vitamins: Secondary | ICD-10-CM

## 2020-03-01 DIAGNOSIS — R748 Abnormal levels of other serum enzymes: Secondary | ICD-10-CM

## 2020-03-01 DIAGNOSIS — Z7289 Other problems related to lifestyle: Secondary | ICD-10-CM

## 2020-03-01 MED ORDER — VITAMIN B-12 1000 MCG PO TABS: 1000.0000 ug | ORAL_TABLET | Freq: Every day | ORAL | 1 refills | Status: AC

## 2020-03-01 NOTE — Patient Instructions (Addendum)
If you are age 72 or older, your body mass index should be between 23-30. Your Body mass index is 32.16 kg/m. If this is out of the aforementioned range listed, please consider follow up with your Primary Care Provider.  If you are age 53 or younger, your body mass index should be between 19-25. Your Body mass index is 32.16 kg/m. If this is out of the aformentioned range listed, please consider follow up with your Primary Care Provider.   Please go to the lab in the basement of our building to have lab work done as you leave today. Hit "B" for basement when you get on the elevator.  When the doors open the lab is on your left.  We will call you with the results. Thank you.  Due to recent changes in healthcare laws, you may see the results of your imaging and laboratory studies on MyChart before your provider has had a chance to review them.  We understand that in some cases there may be results that are confusing or concerning to you. Not all laboratory results come back in the same time frame and the provider may be waiting for multiple results in order to interpret others.  Please give [place] 48 hours in order for your provider to thoroughly review all the results before contacting the office for clarification of your results.    We have sent the following medications to your pharmacy for you to pick up at your convenience: Vitamin B12 1000 mcgs- take one tablet once daily. You can also purchase over-the-counter if you would like.  Please minimize alcohol use.  You will be due for LFTs and B12 lab work in 6 months.  We will remind you when it is time to go to the lab in the basement of our building.   Thank you for entrusting me with your care and for choosing [facility], Dr. Sevier Cellar

## 2020-03-01 NOTE — Progress Notes (Signed)
HPI :  70 year old female here for a follow-up visit for her elevated liver enzymes.  She has a history of hypertension,hyperlipidemia,hypothyroidism and neuropathy. S/P cholecystectomy due to gallstones inthe 1970's.  I know her from a screening colonoscopy in 2019.  She was seen in November by Carl Best for elevations in liver enzymes.  It appears she had a mild elevation liver enzymes in 2019 and more recently since October 2021 that we can see in our system.  She has had a mild AST greater than ALT elevation.  His AST ranging from 40s to 200s, ALT ranging from normal to 70s.  Alk phos and bilirubin normal.  Creatine kinase negative. IgG 1144.  Alpha-1 antitrypsin level 144.  ANA negative.  AMA less than 20.  Hepatitis A total antibody reactive.  Hepatitis B surface antigen nonreactive.  Hepatitis B surface antibody nonreactive.  Hepatitis C antibody nonreactive.  WBC 6.6.  Hemoglobin 14.7.  Hematocrit 43.6.  MCV 105.8.  Platelet 200.  Abdominal ultrasound 06/18/2019: evaluation of the liver was limited by attenuation artifact, visualized parenchyma within normal limits, liver span approximately 15 cm. No intrahepatic biliary ductal dilatation noted. Past cholecystectomy.  She does endorse drinking roughly 2 alcoholic beverages per day.  A glass line with lunch or dinner and a cocktail at night after dinner.  She does this most days of the week.  She has not cut back since we have last seen her in the office.  She denies any family history of liver disease or cirrhosis.  Letter for macrocytosis a vitamin B12 level was sent and she was found to have a vitamin B12 deficiency with a value of 173.  Iron studies were not sent.  She had LFTs drawn last week and her AST is currently 52, ALT 31, alk phos 70, bili normal  Colonoscopy 12/18/2017 - One 2 to 3 mm polyp in the rectum, removed with a cold snare. Resected and retrieved. - Diverticulosis in the sigmoid colon. - The examination  was otherwise normal.   Past Medical History:  Diagnosis Date  . 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 Blood transfusion without reported diagnosis   . Cataract    removed both eyes  . Fibroids    large uterine fibroid  . Fistula    benign-rectal  . Hyperlipidemia   . Hyperplastic polyp of intestine   . Hypertension   . Peripheral neuropathy   . Psoriasis 2018  . Rectal pain   . Thickened endometrium    Unsuccessful endometrial sampling through office [place].    . Thyroid disease    hypothyroidism  . Vitamin D deficiency      Past Surgical History:  Procedure Laterality Date  . CATARACT EXTRACTION Bilateral 3/08, 6/09  . CATARACT EXTRACTION, BILATERAL    . CHOLECYSTECTOMY  10/79  . COLONOSCOPY    . HYSTEROSCOPY  6/05   removal of imbedded IUD  . IUD REMOVAL     surgically removed as it was embedded in the uterus  . POLYPECTOMY     HPP 20 cm 09-01-2007 Dr Lajoyce Corners    Family History  Problem Relation Age of Onset  . Lung cancer Mother   . Heart attack Father   . Diabetes Father   . Colon cancer Neg Hx   . Colon polyps Neg Hx   . Esophageal cancer Neg Hx   . Rectal cancer Neg Hx   . Stomach cancer Neg Hx    Social History  Tobacco Use  . Smoking status: Never Smoker  . Smokeless tobacco: Never Used  Vaping Use  . Vaping Use: Never used  Substance Use Topics  . Alcohol use: Yes    Alcohol/week: 7.0 - 10.0 standard drinks    Types: 7 - 10 Glasses of wine per week    Comment: 9-56 alcoholic beverages/week(beer,wine or liquor)  . Drug use: No   Current Outpatient Medications  Medication Sig Dispense Refill  . Calcium Carb-Cholecalciferol 600-200 MG-UNIT TABS Take 2 tablets by mouth daily.     . Cholecalciferol (VITAMIN D PO) Take 5,000 Int'l Units by mouth daily.     Marland Kitchen levothyroxine (SYNTHROID) 125 MCG tablet Take 125 mcg by mouth daily.    . metoprolol succinate (TOPROL-XL) 25 MG 24 hr tablet Take 1 tablet by mouth daily.  4  . Multiple  Vitamins-Minerals (MULTIVITAMIN PO) Take by mouth daily.     No current facility-administered medications for this visit.   Allergies  Allergen Reactions  . Hctz [Hydrochlorothiazide]   . Sulfa Drugs Cross Reactors Itching     Review of Systems: All systems reviewed and negative except where noted in HPI.   As outlined above in Epic   Lab Results  Component Value Date   ALT 31 02/25/2020   AST 52 (H) 02/25/2020   ALKPHOS 70 02/25/2020   BILITOT 1.0 02/25/2020    Lab Results  Component Value Date   CREATININE 0.88 02/25/2020   BUN 7 02/25/2020   NA 137 02/25/2020   K 3.8 02/25/2020   CL 100 02/25/2020   CO2 27 02/25/2020    Lab Results  Component Value Date   WBC 6.6 11/24/2019   HGB 14.7 11/24/2019   HCT 43.6 11/24/2019   MCV 105.8 (H) 11/24/2019   PLT 200.0 11/24/2019     Physical Exam: BP 134/80 (BP Location: Left Arm, Patient Position: Sitting, Cuff Size: Normal)   Pulse 96 Comment: irregular  Ht 5' 3.25" (1.607 m)   Wt 183 lb (83 kg)   LMP 01/15/2002   BMI 32.16 kg/m  Constitutional: Pleasant,well-developed, female in no acute distress. Abdominal: Soft, nondistended, nontender.  There are no masses palpable. Extremities: no edema Lymphadenopathy: No cervical adenopathy noted. Neurological: Alert and oriented to person place and time. Skin: Skin is warm and dry. No rashes noted. Psychiatric: Normal mood and affect. Behavior is normal.   ASSESSMENT AND PLAN: 70 year old female here for reassessment of following:  Elevated liver enzymes - AST greater than ALT Alcohol use B12 deficiency  Ongoing mild AST greater than ALT elevation.  Work-up as above.  I suspect this is most likely due to ongoing alcohol use.  We discussed her work-up to date.  She is essentially asymptomatic.  I discussed risks of ongoing hepatic inflammation to include risk for fibrotic change and cirrhosis.  There is no evidence of cirrhosis based on her labs thus far.  I do  think long-term she should reduce her volume and frequency of alcohol intake given her liver enzyme elevation.  She will try working on this and understand the risks of continued frequent alcohol use.  To complete her work-up I will send iron studies to rule out hemochromatosis.  I will also send labs to exclude celiac disease in light of her B12 deficiency.  Also in light of her B Default deficiency will send labs for autoimmune gastritis.  If she has this we will then discuss if she wants to pursue upper endoscopy.  She has no upper  tract symptoms otherwise at this time.  Otherwise would repeat LFTs in 6 months and await her course.  She agreed, all questions answered. Of note I offered hep B vaccine today in the office and she declined after a discussion of this  Plan: - labs today - TIBC / ferritin, celiac serologies, anti parietal antibody, anti intrinsic factor antibody - minimize alcohol use - B12 supplementation 1029mg PO q day - repeat LFTs and B12 level in 6 months - consideration for EGD pending course and labs in light of B12 deficiency / workup  SCarolina Cellar MD [facility]

## 2020-03-02 LAB — IRON AND TIBC
Iron Saturation: 43 % (ref 15–55)
Iron: 120 ug/dL (ref 27–139)
Total Iron Binding Capacity: 281 ug/dL (ref 250–450)
UIBC: 161 ug/dL (ref 118–369)

## 2020-03-03 LAB — ANTI-PARIETAL ANTIBODY: PARIETAL CELL AB SCREEN: NEGATIVE

## 2020-03-03 LAB — TISSUE TRANSGLUTAMINASE, IGA: (tTG) Ab, IgA: <1 U/mL

## 2020-03-03 LAB — INTRINSIC FACTOR ANTIBODIES: Intrinsic Factor: NEGATIVE

## 2020-03-15 DIAGNOSIS — E538 Deficiency of other specified B group vitamins: Secondary | ICD-10-CM | POA: Diagnosis not present

## 2020-03-15 DIAGNOSIS — I10 Essential (primary) hypertension: Secondary | ICD-10-CM | POA: Diagnosis not present

## 2020-03-15 DIAGNOSIS — R269 Unspecified abnormalities of gait and mobility: Secondary | ICD-10-CM | POA: Diagnosis not present

## 2020-03-15 DIAGNOSIS — R945 Abnormal results of liver function studies: Secondary | ICD-10-CM | POA: Diagnosis not present

## 2020-03-15 DIAGNOSIS — Z0000 Encounter for general adult medical examination without abnormal findings: Secondary | ICD-10-CM | POA: Diagnosis not present

## 2020-03-15 DIAGNOSIS — J301 Allergic rhinitis due to pollen: Secondary | ICD-10-CM | POA: Diagnosis not present

## 2020-03-15 DIAGNOSIS — E782 Mixed hyperlipidemia: Secondary | ICD-10-CM | POA: Diagnosis not present

## 2020-03-22 DIAGNOSIS — I10 Essential (primary) hypertension: Secondary | ICD-10-CM | POA: Diagnosis not present

## 2020-03-22 DIAGNOSIS — E782 Mixed hyperlipidemia: Secondary | ICD-10-CM | POA: Diagnosis not present

## 2020-03-22 DIAGNOSIS — Z0000 Encounter for general adult medical examination without abnormal findings: Secondary | ICD-10-CM | POA: Diagnosis not present

## 2020-03-22 DIAGNOSIS — E538 Deficiency of other specified B group vitamins: Secondary | ICD-10-CM | POA: Diagnosis not present

## 2020-07-15 DIAGNOSIS — Z1231 Encounter for screening mammogram for malignant neoplasm of breast: Secondary | ICD-10-CM | POA: Diagnosis not present

## 2020-07-27 DIAGNOSIS — R921 Mammographic calcification found on diagnostic imaging of breast: Secondary | ICD-10-CM | POA: Diagnosis not present

## 2020-07-27 DIAGNOSIS — R922 Inconclusive mammogram: Secondary | ICD-10-CM | POA: Diagnosis not present

## 2020-07-27 DIAGNOSIS — R928 Other abnormal and inconclusive findings on diagnostic imaging of breast: Secondary | ICD-10-CM | POA: Diagnosis not present

## 2020-08-16 ENCOUNTER — Other Ambulatory Visit: Payer: Self-pay

## 2020-08-16 ENCOUNTER — Telehealth: Payer: Self-pay

## 2020-08-16 DIAGNOSIS — R748 Abnormal levels of other serum enzymes: Secondary | ICD-10-CM

## 2020-08-16 DIAGNOSIS — E538 Deficiency of other specified B group vitamins: Secondary | ICD-10-CM

## 2020-08-16 NOTE — Telephone Encounter (Signed)
Called and left detailed message for patient to go to the lab one day this week or next for LFTs and VitB12

## 2020-08-16 NOTE — Progress Notes (Signed)
Due for repeat labs in August for [place]

## 2020-08-16 NOTE — Telephone Encounter (Signed)
-----   Message from Roetta Sessions, [place] sent at 03/10/2020  5:17 PM EST ----- Regarding: labs due B12 and LFTs due in 08-2020

## 2020-08-31 ENCOUNTER — Other Ambulatory Visit (INDEPENDENT_AMBULATORY_CARE_PROVIDER_SITE_OTHER): Payer: Medicare Other

## 2020-08-31 DIAGNOSIS — R748 Abnormal levels of other serum enzymes: Secondary | ICD-10-CM

## 2020-08-31 DIAGNOSIS — Z780 Asymptomatic menopausal state: Secondary | ICD-10-CM | POA: Diagnosis not present

## 2020-08-31 DIAGNOSIS — E538 Deficiency of other specified B group vitamins: Secondary | ICD-10-CM | POA: Diagnosis not present

## 2020-08-31 DIAGNOSIS — M8589 Other specified disorders of bone density and structure, multiple sites: Secondary | ICD-10-CM | POA: Diagnosis not present

## 2020-08-31 LAB — HEPATIC FUNCTION PANEL
ALT: 34 U/L (ref 0–35)
AST: 50 U/L — ABNORMAL HIGH (ref 0–37)
Albumin: 3.6 g/dL (ref 3.5–5.2)
Alkaline Phosphatase: 71 U/L (ref 39–117)
Bilirubin, Direct: 0.2 mg/dL (ref 0.0–0.3)
Total Bilirubin: 0.9 mg/dL (ref 0.2–1.2)
Total Protein: 6.8 g/dL (ref 6.0–8.3)

## 2020-08-31 LAB — VITAMIN B12: Vitamin B-12: 375 pg/mL (ref 211–911)

## 2021-02-01 ENCOUNTER — Encounter: Payer: Self-pay | Admitting: Gastroenterology

## 2021-04-04 DIAGNOSIS — E782 Mixed hyperlipidemia: Secondary | ICD-10-CM | POA: Diagnosis not present

## 2021-04-04 DIAGNOSIS — E538 Deficiency of other specified B group vitamins: Secondary | ICD-10-CM | POA: Diagnosis not present

## 2021-04-04 DIAGNOSIS — E039 Hypothyroidism, unspecified: Secondary | ICD-10-CM | POA: Diagnosis not present

## 2021-04-04 DIAGNOSIS — I10 Essential (primary) hypertension: Secondary | ICD-10-CM | POA: Diagnosis not present

## 2021-04-05 DIAGNOSIS — E782 Mixed hyperlipidemia: Secondary | ICD-10-CM | POA: Diagnosis not present

## 2021-04-05 DIAGNOSIS — I10 Essential (primary) hypertension: Secondary | ICD-10-CM | POA: Diagnosis not present

## 2021-04-05 DIAGNOSIS — E538 Deficiency of other specified B group vitamins: Secondary | ICD-10-CM | POA: Diagnosis not present

## 2021-04-05 DIAGNOSIS — E039 Hypothyroidism, unspecified: Secondary | ICD-10-CM | POA: Diagnosis not present

## 2021-04-11 DIAGNOSIS — E039 Hypothyroidism, unspecified: Secondary | ICD-10-CM | POA: Diagnosis not present

## 2021-04-11 DIAGNOSIS — E782 Mixed hyperlipidemia: Secondary | ICD-10-CM | POA: Diagnosis not present

## 2021-04-11 DIAGNOSIS — Z0000 Encounter for general adult medical examination without abnormal findings: Secondary | ICD-10-CM | POA: Diagnosis not present

## 2021-04-11 DIAGNOSIS — I10 Essential (primary) hypertension: Secondary | ICD-10-CM | POA: Diagnosis not present

## 2021-07-04 DIAGNOSIS — I10 Essential (primary) hypertension: Secondary | ICD-10-CM | POA: Diagnosis not present

## 2021-07-04 DIAGNOSIS — E039 Hypothyroidism, unspecified: Secondary | ICD-10-CM | POA: Diagnosis not present

## 2021-07-04 DIAGNOSIS — E538 Deficiency of other specified B group vitamins: Secondary | ICD-10-CM | POA: Diagnosis not present

## 2021-07-04 DIAGNOSIS — E782 Mixed hyperlipidemia: Secondary | ICD-10-CM | POA: Diagnosis not present

## 2021-07-11 DIAGNOSIS — E039 Hypothyroidism, unspecified: Secondary | ICD-10-CM | POA: Diagnosis not present

## 2021-07-11 DIAGNOSIS — E538 Deficiency of other specified B group vitamins: Secondary | ICD-10-CM | POA: Diagnosis not present

## 2021-07-11 DIAGNOSIS — E782 Mixed hyperlipidemia: Secondary | ICD-10-CM | POA: Diagnosis not present

## 2021-07-11 DIAGNOSIS — I10 Essential (primary) hypertension: Secondary | ICD-10-CM | POA: Diagnosis not present

## 2021-07-21 DIAGNOSIS — Z1231 Encounter for screening mammogram for malignant neoplasm of breast: Secondary | ICD-10-CM | POA: Diagnosis not present

## 2022-04-17 DIAGNOSIS — Z0000 Encounter for general adult medical examination without abnormal findings: Secondary | ICD-10-CM | POA: Diagnosis not present

## 2022-04-17 DIAGNOSIS — E538 Deficiency of other specified B group vitamins: Secondary | ICD-10-CM | POA: Diagnosis not present

## 2022-04-17 DIAGNOSIS — I10 Essential (primary) hypertension: Secondary | ICD-10-CM | POA: Diagnosis not present

## 2022-04-17 DIAGNOSIS — E039 Hypothyroidism, unspecified: Secondary | ICD-10-CM | POA: Diagnosis not present

## 2022-04-17 DIAGNOSIS — E782 Mixed hyperlipidemia: Secondary | ICD-10-CM | POA: Diagnosis not present

## 2022-04-27 DIAGNOSIS — E538 Deficiency of other specified B group vitamins: Secondary | ICD-10-CM | POA: Diagnosis not present

## 2022-04-27 DIAGNOSIS — I10 Essential (primary) hypertension: Secondary | ICD-10-CM | POA: Diagnosis not present

## 2022-04-27 DIAGNOSIS — E039 Hypothyroidism, unspecified: Secondary | ICD-10-CM | POA: Diagnosis not present

## 2022-04-27 DIAGNOSIS — E782 Mixed hyperlipidemia: Secondary | ICD-10-CM | POA: Diagnosis not present

## 2022-08-03 DIAGNOSIS — Z1231 Encounter for screening mammogram for malignant neoplasm of breast: Secondary | ICD-10-CM | POA: Diagnosis not present

## 2023-05-10 DIAGNOSIS — E039 Hypothyroidism, unspecified: Secondary | ICD-10-CM | POA: Diagnosis not present

## 2023-05-10 DIAGNOSIS — I10 Essential (primary) hypertension: Secondary | ICD-10-CM | POA: Diagnosis not present

## 2023-05-10 DIAGNOSIS — Z0000 Encounter for general adult medical examination without abnormal findings: Secondary | ICD-10-CM | POA: Diagnosis not present

## 2023-05-10 DIAGNOSIS — E782 Mixed hyperlipidemia: Secondary | ICD-10-CM | POA: Diagnosis not present

## 2023-05-17 DIAGNOSIS — R7303 Prediabetes: Secondary | ICD-10-CM | POA: Diagnosis not present

## 2023-05-17 DIAGNOSIS — Z0000 Encounter for general adult medical examination without abnormal findings: Secondary | ICD-10-CM | POA: Diagnosis not present

## 2023-05-17 DIAGNOSIS — E538 Deficiency of other specified B group vitamins: Secondary | ICD-10-CM | POA: Diagnosis not present

## 2023-05-17 DIAGNOSIS — E039 Hypothyroidism, unspecified: Secondary | ICD-10-CM | POA: Diagnosis not present

## 2023-05-17 DIAGNOSIS — G609 Hereditary and idiopathic neuropathy, unspecified: Secondary | ICD-10-CM | POA: Diagnosis not present

## 2023-07-12 DIAGNOSIS — E039 Hypothyroidism, unspecified: Secondary | ICD-10-CM | POA: Diagnosis not present

## 2023-07-12 DIAGNOSIS — E538 Deficiency of other specified B group vitamins: Secondary | ICD-10-CM | POA: Diagnosis not present

## 2023-07-12 DIAGNOSIS — E782 Mixed hyperlipidemia: Secondary | ICD-10-CM | POA: Diagnosis not present

## 2023-07-12 DIAGNOSIS — R7303 Prediabetes: Secondary | ICD-10-CM | POA: Diagnosis not present

## 2023-08-09 DIAGNOSIS — Z1231 Encounter for screening mammogram for malignant neoplasm of breast: Secondary | ICD-10-CM | POA: Diagnosis not present

## 2023-08-30 DIAGNOSIS — E039 Hypothyroidism, unspecified: Secondary | ICD-10-CM | POA: Diagnosis not present

## 2023-09-06 DIAGNOSIS — R000 Tachycardia, unspecified: Secondary | ICD-10-CM | POA: Diagnosis not present

## 2023-09-06 DIAGNOSIS — E782 Mixed hyperlipidemia: Secondary | ICD-10-CM | POA: Diagnosis not present

## 2023-09-06 DIAGNOSIS — I4891 Unspecified atrial fibrillation: Secondary | ICD-10-CM | POA: Diagnosis not present

## 2023-09-06 DIAGNOSIS — I10 Essential (primary) hypertension: Secondary | ICD-10-CM | POA: Diagnosis not present

## 2023-10-03 DIAGNOSIS — I499 Cardiac arrhythmia, unspecified: Secondary | ICD-10-CM | POA: Diagnosis not present

## 2023-10-03 DIAGNOSIS — I4891 Unspecified atrial fibrillation: Secondary | ICD-10-CM | POA: Diagnosis not present

## 2023-11-14 DIAGNOSIS — I10 Essential (primary) hypertension: Secondary | ICD-10-CM | POA: Diagnosis not present

## 2023-11-14 DIAGNOSIS — I4891 Unspecified atrial fibrillation: Secondary | ICD-10-CM | POA: Diagnosis not present

## 2023-11-21 DIAGNOSIS — E039 Hypothyroidism, unspecified: Secondary | ICD-10-CM | POA: Diagnosis not present

## 2023-11-21 DIAGNOSIS — I10 Essential (primary) hypertension: Secondary | ICD-10-CM | POA: Diagnosis not present

## 2023-11-21 DIAGNOSIS — I4891 Unspecified atrial fibrillation: Secondary | ICD-10-CM | POA: Diagnosis not present

## 2023-11-21 DIAGNOSIS — E782 Mixed hyperlipidemia: Secondary | ICD-10-CM | POA: Diagnosis not present
